# Patient Record
Sex: Male | Born: 1945 | Race: White | Hispanic: No | Smoking: Never smoker
Health system: Southern US, Community
[De-identification: ages and names within clinical notes are randomized; demographics above are authoritative.]

## PROBLEM LIST (undated history)

## (undated) DIAGNOSIS — R079 Chest pain, unspecified: Secondary | ICD-10-CM

## (undated) DIAGNOSIS — G720 Drug-induced myopathy: Secondary | ICD-10-CM

## (undated) DIAGNOSIS — I10 Essential (primary) hypertension: Secondary | ICD-10-CM

## (undated) DIAGNOSIS — E291 Testicular hypofunction: Secondary | ICD-10-CM

## (undated) DIAGNOSIS — N529 Male erectile dysfunction, unspecified: Secondary | ICD-10-CM

## (undated) DIAGNOSIS — E119 Type 2 diabetes mellitus without complications: Secondary | ICD-10-CM

## (undated) DIAGNOSIS — H348112 Central retinal vein occlusion, right eye, stable: Secondary | ICD-10-CM

## (undated) DIAGNOSIS — K219 Gastro-esophageal reflux disease without esophagitis: Secondary | ICD-10-CM

## (undated) DIAGNOSIS — M109 Gout, unspecified: Secondary | ICD-10-CM

## (undated) DIAGNOSIS — E785 Hyperlipidemia, unspecified: Secondary | ICD-10-CM

## (undated) HISTORY — DX: Gastro-esophageal reflux disease without esophagitis: K21.9

## (undated) HISTORY — PX: CHOLECYSTECTOMY: SHX55

## (undated) HISTORY — DX: Essential (primary) hypertension: I10

## (undated) HISTORY — DX: Drug-induced myopathy: G72.0

## (undated) HISTORY — DX: Central retinal vein occlusion, right eye, stable: H34.8112

## (undated) HISTORY — DX: Chest pain, unspecified: R07.9

## (undated) HISTORY — DX: Male erectile dysfunction, unspecified: N52.9

## (undated) HISTORY — DX: Gout, unspecified: M10.9

## (undated) HISTORY — DX: Type 2 diabetes mellitus without complications: E11.9

## (undated) HISTORY — DX: Testicular hypofunction: E29.1

## (undated) HISTORY — DX: Hyperlipidemia, unspecified: E78.5

---

## 2017-01-03 DIAGNOSIS — J0101 Acute recurrent maxillary sinusitis: Secondary | ICD-10-CM | POA: Diagnosis not present

## 2017-01-03 DIAGNOSIS — N401 Enlarged prostate with lower urinary tract symptoms: Secondary | ICD-10-CM | POA: Diagnosis not present

## 2017-01-03 DIAGNOSIS — E291 Testicular hypofunction: Secondary | ICD-10-CM | POA: Diagnosis not present

## 2017-01-08 DIAGNOSIS — E291 Testicular hypofunction: Secondary | ICD-10-CM | POA: Diagnosis not present

## 2017-01-12 DIAGNOSIS — J3089 Other allergic rhinitis: Secondary | ICD-10-CM | POA: Diagnosis not present

## 2017-01-16 DIAGNOSIS — H524 Presbyopia: Secondary | ICD-10-CM | POA: Diagnosis not present

## 2017-01-26 DIAGNOSIS — J320 Chronic maxillary sinusitis: Secondary | ICD-10-CM | POA: Diagnosis not present

## 2017-01-26 DIAGNOSIS — E291 Testicular hypofunction: Secondary | ICD-10-CM | POA: Diagnosis not present

## 2017-01-30 DIAGNOSIS — N401 Enlarged prostate with lower urinary tract symptoms: Secondary | ICD-10-CM | POA: Diagnosis not present

## 2017-01-30 DIAGNOSIS — N5201 Erectile dysfunction due to arterial insufficiency: Secondary | ICD-10-CM | POA: Diagnosis not present

## 2017-01-30 DIAGNOSIS — R351 Nocturia: Secondary | ICD-10-CM | POA: Diagnosis not present

## 2017-01-30 DIAGNOSIS — E291 Testicular hypofunction: Secondary | ICD-10-CM | POA: Diagnosis not present

## 2017-02-01 DIAGNOSIS — R7989 Other specified abnormal findings of blood chemistry: Secondary | ICD-10-CM | POA: Diagnosis not present

## 2017-02-01 DIAGNOSIS — E291 Testicular hypofunction: Secondary | ICD-10-CM | POA: Diagnosis not present

## 2017-03-12 DIAGNOSIS — E291 Testicular hypofunction: Secondary | ICD-10-CM | POA: Diagnosis not present

## 2017-03-21 DIAGNOSIS — E291 Testicular hypofunction: Secondary | ICD-10-CM | POA: Diagnosis not present

## 2017-04-03 DIAGNOSIS — E291 Testicular hypofunction: Secondary | ICD-10-CM | POA: Diagnosis not present

## 2017-04-12 DIAGNOSIS — J40 Bronchitis, not specified as acute or chronic: Secondary | ICD-10-CM | POA: Diagnosis not present

## 2017-04-12 DIAGNOSIS — Z6833 Body mass index (BMI) 33.0-33.9, adult: Secondary | ICD-10-CM | POA: Diagnosis not present

## 2017-04-13 DIAGNOSIS — E291 Testicular hypofunction: Secondary | ICD-10-CM | POA: Diagnosis not present

## 2017-04-16 DIAGNOSIS — Z6833 Body mass index (BMI) 33.0-33.9, adult: Secondary | ICD-10-CM | POA: Diagnosis not present

## 2017-04-16 DIAGNOSIS — J22 Unspecified acute lower respiratory infection: Secondary | ICD-10-CM | POA: Diagnosis not present

## 2017-05-03 DIAGNOSIS — E291 Testicular hypofunction: Secondary | ICD-10-CM | POA: Diagnosis not present

## 2017-05-07 DIAGNOSIS — R609 Edema, unspecified: Secondary | ICD-10-CM | POA: Diagnosis not present

## 2017-05-07 DIAGNOSIS — R600 Localized edema: Secondary | ICD-10-CM | POA: Diagnosis not present

## 2017-05-07 DIAGNOSIS — M79604 Pain in right leg: Secondary | ICD-10-CM | POA: Diagnosis not present

## 2017-05-07 DIAGNOSIS — M7989 Other specified soft tissue disorders: Secondary | ICD-10-CM | POA: Diagnosis not present

## 2017-05-14 DIAGNOSIS — E291 Testicular hypofunction: Secondary | ICD-10-CM | POA: Diagnosis not present

## 2017-05-22 DIAGNOSIS — Z6833 Body mass index (BMI) 33.0-33.9, adult: Secondary | ICD-10-CM | POA: Diagnosis not present

## 2017-05-22 DIAGNOSIS — J019 Acute sinusitis, unspecified: Secondary | ICD-10-CM | POA: Diagnosis not present

## 2017-05-22 DIAGNOSIS — B9689 Other specified bacterial agents as the cause of diseases classified elsewhere: Secondary | ICD-10-CM | POA: Diagnosis not present

## 2017-05-30 DIAGNOSIS — E291 Testicular hypofunction: Secondary | ICD-10-CM | POA: Diagnosis not present

## 2017-06-06 DIAGNOSIS — R351 Nocturia: Secondary | ICD-10-CM | POA: Diagnosis not present

## 2017-06-06 DIAGNOSIS — N5201 Erectile dysfunction due to arterial insufficiency: Secondary | ICD-10-CM | POA: Diagnosis not present

## 2017-06-06 DIAGNOSIS — E291 Testicular hypofunction: Secondary | ICD-10-CM | POA: Diagnosis not present

## 2017-06-06 DIAGNOSIS — N401 Enlarged prostate with lower urinary tract symptoms: Secondary | ICD-10-CM | POA: Diagnosis not present

## 2017-06-15 DIAGNOSIS — E291 Testicular hypofunction: Secondary | ICD-10-CM | POA: Diagnosis not present

## 2017-07-02 DIAGNOSIS — E291 Testicular hypofunction: Secondary | ICD-10-CM | POA: Diagnosis not present

## 2017-07-12 DIAGNOSIS — H43811 Vitreous degeneration, right eye: Secondary | ICD-10-CM | POA: Diagnosis not present

## 2017-07-12 DIAGNOSIS — H2513 Age-related nuclear cataract, bilateral: Secondary | ICD-10-CM | POA: Diagnosis not present

## 2017-07-12 DIAGNOSIS — H04123 Dry eye syndrome of bilateral lacrimal glands: Secondary | ICD-10-CM | POA: Diagnosis not present

## 2017-07-12 DIAGNOSIS — H34831 Tributary (branch) retinal vein occlusion, right eye, with macular edema: Secondary | ICD-10-CM | POA: Diagnosis not present

## 2017-07-16 DIAGNOSIS — E291 Testicular hypofunction: Secondary | ICD-10-CM | POA: Diagnosis not present

## 2017-07-18 DIAGNOSIS — Z9181 History of falling: Secondary | ICD-10-CM | POA: Diagnosis not present

## 2017-07-18 DIAGNOSIS — K219 Gastro-esophageal reflux disease without esophagitis: Secondary | ICD-10-CM | POA: Insufficient documentation

## 2017-07-18 DIAGNOSIS — Z0000 Encounter for general adult medical examination without abnormal findings: Secondary | ICD-10-CM | POA: Diagnosis not present

## 2017-07-18 DIAGNOSIS — Z23 Encounter for immunization: Secondary | ICD-10-CM | POA: Diagnosis not present

## 2017-07-18 DIAGNOSIS — R079 Chest pain, unspecified: Secondary | ICD-10-CM

## 2017-07-18 DIAGNOSIS — I209 Angina pectoris, unspecified: Secondary | ICD-10-CM | POA: Insufficient documentation

## 2017-07-18 HISTORY — DX: Chest pain, unspecified: R07.9

## 2017-07-18 HISTORY — DX: Gastro-esophageal reflux disease without esophagitis: K21.9

## 2017-07-19 ENCOUNTER — Ambulatory Visit (INDEPENDENT_AMBULATORY_CARE_PROVIDER_SITE_OTHER): Payer: PPO | Admitting: Cardiology

## 2017-07-19 ENCOUNTER — Encounter: Payer: Self-pay | Admitting: Cardiology

## 2017-07-19 VITALS — BP 152/84 | HR 64 | Ht 60.0 in | Wt 207.0 lb

## 2017-07-19 DIAGNOSIS — I10 Essential (primary) hypertension: Secondary | ICD-10-CM

## 2017-07-19 DIAGNOSIS — R079 Chest pain, unspecified: Secondary | ICD-10-CM

## 2017-07-19 NOTE — Patient Instructions (Addendum)
Medication Instructions:   Your physician recommends that you continue on your current medications as directed. Please refer to the Current Medication list given to you today.   Labwork: Your physician recommends that you return for lab work in:  Today, Liver Function test and Lipid profile.   Testing/Procedures:  Your physician has requested that you have en exercise stress myoview. For further information please visit [email or web address]. Please follow instruction sheet, as given.     Follow-Up: Your physician recommends that you schedule a follow-up appointment in: In 2 months with Dr. Geraldo Pitter.    Any Other Special Instructions Will Be Listed Below (If Applicable).     If you need a refill on your cardiac medications before your next appointment, please call your pharmacy.

## 2017-07-19 NOTE — Addendum Note (Signed)
Addended by: Orland Penman on: 07/19/2017 11:21 AM   Modules accepted: Orders

## 2017-07-19 NOTE — Progress Notes (Signed)
Cardiology Office Note:    Date:  07/19/2017   ID:  Jack Him., DOB [birth year], MRN [id number]  PCP:  Myer Peer, MD  Cardiologist:  Jenean Lindau, MD   Referring MD: Myer Peer, MD    ASSESSMENT:    1. Chest pain, unspecified type   2. Essential hypertension    PLAN:    In order of problems listed above:  1. The patient's symptoms are of mixed characteristics. I am not able to put her finger on his symptoms to see whether this is angina. He has multiple risk factors for coronary artery disease and yet he has the symptoms when he walks and they go away when he continues walking. In view of those risk factors include his age and the fact that he has family history of coronary artery disease I will set him up for a exercise stress Cardiolite. He will not take his beta blockers on the morning of the test. He will continue taking his aspirin. 2. His blood pressure stable and he will continue to monitor this. 3. He does have sublingual nitroglycerin and knows how to use it. He will go to the nearest emergency room if this does not work. 4. He gives history of diet-controlled diabetes mellitus. We will check his lipids. He will need to be considered for statin therapy in view of his diabetes mellitus. He will be seen in follow-up appointment in 2 months or earlier if he has any concerns.   Medication Adjustments/Labs and Tests Ordered: Current medicines are reviewed at length with the patient today.  Concerns regarding medicines are outlined above.  No orders of the defined types were placed in this encounter.  No orders of the defined types were placed in this encounter.    History of Present Illness:    Jack Blasius. is a 71 y.o. male who is being seen today for the evaluation of Chest pain at the request of Myer Peer, MD. Patient is a pleasant 71 year old male. He is previously unknown to me. He has history of essential hypertension and diet-controlled  diabetes mellitus. Patient mentions to me that he can walk 3-4 miles a day on a regular basis and does so. However when he walks up. He feels some chest discomfort. Then he continues walking and walks downhill without taking a break and with this he has no symptoms at all. At the time of my evaluation is alert awake oriented and in no distress. And this happens at times only he has an unusual sensation in his left arm. No orthopnea or PND. At the time of my evaluation he is alert awake oriented and in no distress. His daughter accompanies him for this visit  Past Medical History:  Diagnosis Date  . Acid reflux 07/18/2017  . Chest pain 07/18/2017  . Gout   . Hypertension     Past Surgical History:  Procedure Laterality Date  . CHOLECYSTECTOMY      Current Medications: Current Meds  Medication Sig  . allopurinol (ZYLOPRIM) 300 MG tablet Take 300 mg by mouth daily.  Marland Kitchen aspirin 81 MG chewable tablet Chew 81 mg by mouth daily.  Marland Kitchen azelastine (ASTELIN) 0.1 % nasal spray Place 1 spray into both nostrils 2 (two) times daily as needed for allergies.  . bumetanide (BUMEX) 0.5 MG tablet Take 0.5 mg by mouth daily.  . cycloSPORINE (RESTASIS) 0.05 % ophthalmic emulsion Place 1 drop into both eyes 2 (two) times daily.  Marland Kitchen  esomeprazole (NEXIUM) 40 MG capsule Take 40 mg by mouth daily.  . fluticasone (FLONASE) 50 MCG/ACT nasal spray Place 1 spray into both nostrils daily.  . Magnesium 250 MG TABS Take 1 tablet by mouth daily.  . metoprolol tartrate (LOPRESSOR) 50 MG tablet Take 50 mg by mouth 3 (three) times daily.  . nitroGLYCERIN (NITROSTAT) 0.4 MG SL tablet Place 0.4 mg under the tongue every 5 (five) minutes x 3 doses as needed for chest pain.  . Omega-3 Fatty Acids (FISH OIL PO) Take 1,000 mg by mouth daily.  . polyethylene glycol (MIRALAX / GLYCOLAX) packet Take 17 g by mouth daily.  . psyllium (METAMUCIL SMOOTH TEXTURE) 28 % packet Take 1 packet by mouth daily.  . ranitidine (ZANTAC) 300 MG tablet  Take 300 mg by mouth daily.  . tamsulosin (FLOMAX) 0.4 MG CAPS capsule Take 0.4 capsules by mouth daily.  Marland Kitchen testosterone cypionate (DEPOTESTOSTERONE CYPIONATE) 200 MG/ML injection Inject 0.4 ml twice monthly  . Zinc 50 MG TABS Take 1 tablet by mouth daily.     Allergies:   Azithromycin; Ciprofloxacin; Doxycycline; Flomax [tamsulosin hcl]; Levofloxacin; Penicillins; Ramipril; Sulfa antibiotics; Suprax [cefixime]; and Tramadol   Social History   Social History  . Marital status: Widowed    Spouse name: N/A  . Number of children: N/A  . Years of education: N/A   Social History Main Topics  . Smoking status: Never Smoker  . Smokeless tobacco: Never Used  . Alcohol use No  . Drug use: No  . Sexual activity: Not Asked   Other Topics Concern  . None   Social History Narrative  . None     Family History: The patient's family history includes Dementia in his mother.  ROS:   Please see the history of present illness.    All other systems reviewed and are negative.  EKGs/Labs/Other Studies Reviewed:    The following studies were reviewed today:EKG reveals sinus rhythm and nonspecific ST-T changes    Recent Labs: No results found for requested labs within last 8760 hours.  Recent Lipid Panel No results found for: CHOL, TRIG, HDL, CHOLHDL, VLDL, LDLCALC, LDLDIRECT  Physical Exam:    VS:  BP (!) 152/84   Pulse 64   Ht 5' (1.524 m)   Wt 207 lb (93.9 kg)   SpO2 98%   BMI 40.43 kg/m     Wt Readings from Last 3 Encounters:  07/19/17 207 lb (93.9 kg)     GEN: Patient is in no acute distress HEENT: Normal NECK: No JVD; No carotid bruits LYMPHATICS: No lymphadenopathy CARDIAC: S1 S2 regular, 2/6 systolic murmur at the apex. RESPIRATORY:  Clear to auscultation without rales, wheezing or rhonchi  ABDOMEN: Soft, non-tender, non-distended MUSCULOSKELETAL:  No edema; No deformity  SKIN: Warm and dry NEUROLOGIC:  Alert and oriented x 3 PSYCHIATRIC:  Normal affect     Signed, Jenean Lindau, MD  07/19/2017 10:34 AM    [place]

## 2017-07-20 LAB — HEPATIC FUNCTION PANEL
ALK PHOS: 62 IU/L (ref 39–117)
ALT: 20 IU/L (ref 0–44)
AST: 24 IU/L (ref 0–40)
Albumin: 4.6 g/dL (ref 3.6–4.8)
Bilirubin Total: 0.8 mg/dL (ref 0.0–1.2)
Bilirubin, Direct: 0.23 mg/dL (ref 0.00–0.40)
TOTAL PROTEIN: 7.3 g/dL (ref 6.0–8.5)

## 2017-07-20 LAB — LIPID PANEL
CHOLESTEROL TOTAL: 157 mg/dL (ref 100–199)
Chol/HDL Ratio: 3.7 ratio (ref 0.0–5.0)
HDL: 42 mg/dL (ref >39)
LDL Calculated: 84 mg/dL (ref 0–99)
TRIGLYCERIDES: 154 mg/dL — AB (ref 0–149)
VLDL Cholesterol Cal: 31 mg/dL (ref 5–40)

## 2017-07-23 ENCOUNTER — Telehealth (HOSPITAL_COMMUNITY): Payer: Self-pay | Admitting: *Deleted

## 2017-07-23 NOTE — Telephone Encounter (Signed)
Patient given detailed instructions per Myocardial Perfusion Study Information Sheet for the test on 07/25/17. Patient notified to arrive 15 minutes early and that it is imperative to arrive on time for appointment to keep from having the test rescheduled.  If you need to cancel or reschedule your appointment, please call the office within 24 hours of your appointment. . Patient verbalized understanding. Shelley Cocke Jacqueline    

## 2017-07-25 ENCOUNTER — Ambulatory Visit (HOSPITAL_COMMUNITY): Payer: PPO | Attending: Cardiology

## 2017-07-25 DIAGNOSIS — Z8249 Family history of ischemic heart disease and other diseases of the circulatory system: Secondary | ICD-10-CM | POA: Insufficient documentation

## 2017-07-25 DIAGNOSIS — R079 Chest pain, unspecified: Secondary | ICD-10-CM | POA: Insufficient documentation

## 2017-07-25 DIAGNOSIS — I10 Essential (primary) hypertension: Secondary | ICD-10-CM | POA: Diagnosis not present

## 2017-07-25 DIAGNOSIS — R9439 Abnormal result of other cardiovascular function study: Secondary | ICD-10-CM | POA: Insufficient documentation

## 2017-07-25 HISTORY — PX: NM MYOVIEW LTD: HXRAD82

## 2017-07-25 LAB — MYOCARDIAL PERFUSION IMAGING
CHL CUP NUCLEAR SSS: 7
CSEPED: 5 min
Estimated workload: 7 METS
Exercise duration (sec): 31 s
LV dias vol: 108 mL (ref 62–150)
LV sys vol: 42 mL
MPHR: 151 {beats}/min
Peak HR: 137 {beats}/min
Percent HR: 90 %
RATE: 0.31
RPE: 16
Rest HR: 63 {beats}/min
SDS: 3
SRS: 4
TID: 0.92

## 2017-07-25 MED ORDER — TECHNETIUM TC 99M TETROFOSMIN IV KIT
10.4000 | PACK | Freq: Once | INTRAVENOUS | Status: AC | PRN
  Administered 2017-07-25: 10.4 via INTRAVENOUS
  Filled 2017-07-25: qty 11

## 2017-07-25 MED ORDER — TECHNETIUM TC 99M TETROFOSMIN IV KIT
32.7000 | PACK | Freq: Once | INTRAVENOUS | Status: AC | PRN
Start: 2017-07-25 — End: 2017-07-25
  Administered 2017-07-25: 32.7 via INTRAVENOUS
  Filled 2017-07-25: qty 33

## 2017-07-29 ENCOUNTER — Encounter: Payer: Self-pay | Admitting: Cardiology

## 2017-07-30 ENCOUNTER — Ambulatory Visit (HOSPITAL_BASED_OUTPATIENT_CLINIC_OR_DEPARTMENT_OTHER)
Admission: RE | Admit: 2017-07-30 | Discharge: 2017-07-30 | Disposition: A | Discharge status: Home / Self Care | Payer: PPO | Source: Ambulatory Visit | Attending: Cardiology | Admitting: Cardiology

## 2017-07-30 ENCOUNTER — Ambulatory Visit (INDEPENDENT_AMBULATORY_CARE_PROVIDER_SITE_OTHER): Payer: PPO | Admitting: Cardiology

## 2017-07-30 ENCOUNTER — Encounter: Payer: Self-pay | Admitting: Cardiology

## 2017-07-30 ENCOUNTER — Telehealth: Payer: Self-pay | Admitting: Cardiology

## 2017-07-30 VITALS — BP 180/76 | HR 59 | Ht 67.0 in | Wt 206.1 lb

## 2017-07-30 DIAGNOSIS — I10 Essential (primary) hypertension: Secondary | ICD-10-CM | POA: Insufficient documentation

## 2017-07-30 DIAGNOSIS — R9439 Abnormal result of other cardiovascular function study: Secondary | ICD-10-CM | POA: Diagnosis not present

## 2017-07-30 DIAGNOSIS — I517 Cardiomegaly: Secondary | ICD-10-CM | POA: Diagnosis not present

## 2017-07-30 DIAGNOSIS — I209 Angina pectoris, unspecified: Secondary | ICD-10-CM

## 2017-07-30 DIAGNOSIS — Z01818 Encounter for other preprocedural examination: Secondary | ICD-10-CM | POA: Diagnosis not present

## 2017-07-30 MED ORDER — AMLODIPINE BESYLATE 5 MG PO TABS: 5.0000 mg | ORAL_TABLET | Freq: Every day | ORAL | 3 refills | Status: DC

## 2017-07-30 NOTE — Telephone Encounter (Signed)
Has questions about the new BP med he was started on

## 2017-07-30 NOTE — Telephone Encounter (Signed)
Patient advised to continue metoprolol along with starting amlodipine.

## 2017-07-30 NOTE — Progress Notes (Signed)
Cardiology Office Note:    Date:  07/31/2017   ID:  [name]., DOB September 04, 1946, MRN [id number]  PCP:  Heywood Bene, MD  Cardiologist:  Norman Herrlich, MD    Referring MD: Heywood Bene, MD    ASSESSMENT:    1. Angina pectoris (HCC)   2. Essential hypertension   3. Abnormal myocardial perfusion study    PLAN:    In order of problems listed above:  1. He has typical exertional walk 3 angina but is also at rest nocturnal episodes his symptoms are somewhat unstable and will be referred for coronary angiography 2. Poorly controlled, ARB added to his medical regimen 3. Plan for coronary angiography as detailed under history   Next appointment: 3-4 weeks   Medication Adjustments/Labs and Tests Ordered: Current medicines are reviewed at length with the patient today.  Concerns regarding medicines are outlined above.  Orders Placed This Encounter  Procedures  . DG Chest 2 View  . Comprehensive Metabolic Panel (CMET)  . CBC with Differential/Platelet   Meds ordered this encounter  Medications  . amLODipine (NORVASC) 5 MG tablet    Sig: Take 1 tablet (5 mg total) by mouth daily.    Dispense:  30 tablet    Refill:  3    Chief Complaint  Patient presents with  . Follow-up    abnormal nuc pt states still has some chest pain/ but also reflux so not really sure between the two.    History of Present Illness:    Jack Oconnor. is a 71 y.o. male with a hx of walk through exertional angina and hypertension last seen 07/19/17. For the last 4 months he has had typical exertional angina that occurs in the morning when he first starts to walk or walks uphill and after he is able to slow his pace or to walk downhill the symptoms resolved and do not recur on his daily exercise. He's not been forced to stop and rest as of yet. The chest pain is pressure left precordium radiates through the back no associated shortness of breath nausea or vomiting moderate in nature. In the last  few weeks he has had one episode that awakened him from his sleep lasted approximately 5 minutes. He also has belching associated with this but is not having typical symptoms of reflux. Recent myocardial perfusion study which showed a markedly abnormal EKG persisting 5-9 minutes into recovery and normal perfusion images raising concern for balanced ischemia and multivessel CAD. I met with the patient the office reviewed the findings. With his episode of nocturnal angina concern his symptoms are becoming unstable and with his markedly abnormal EKG and concerns of multivessel ischemia with a balance perfusion image I advised him undergo coronary angiography. Benefits risk and options were detailed he has no dye allergy he is not at risk of bleeding from dual antiplatelet therapy and is compliant with medical follow-up. The patient chooses angiography over them. Medical therapy and be scheduled for coronary angiography in the next 2 weeks.    Stress test 07/25/17:Study Highlights     Nuclear stress EF: 61%.  Blood pressure demonstrated a hypertensive response to exercise.  Horizontal ST segment depression ST segment depression of 1.5 mm was noted during stress in the II, III, aVF, V4, V5 and V6 leads, and returning to baseline after 5-9 minutes of recovery.  The study is normal.  The left ventricular ejection fraction is normal (55-65%).   1. Hypertensive BP response  to exercise.  2. Significant ST segment depression on exercise ECG concerning for ischemia.  3. EF 61% with normal wall motion.  4. Fixed, medium-sized, mild basal to mid inferior perfusion defect.  Given normal wall motion, this is suggestive of soft tissue attenuation.   Equivocal study.  Concerning for ischemia by exercise ECG but no definite ischemia by perfusion images.     Compliance with diet, lifestyle and medications: Yes Past Medical History:  Diagnosis Date  . Acid reflux 07/18/2017  . Chest pain 07/18/2017  . Gout    . Hypertension     Past Surgical History:  Procedure Laterality Date  . CHOLECYSTECTOMY      Current Medications: Current Meds  Medication Sig  . allopurinol (ZYLOPRIM) 300 MG tablet Take 300 mg by mouth at bedtime.   Marland Kitchen azelastine (ASTELIN) 0.1 % nasal spray Place 1 spray into both nostrils 2 (two) times daily as needed for allergies.  . bumetanide (BUMEX) 0.5 MG tablet Take 0.5 mg by mouth daily.  . cycloSPORINE (RESTASIS) 0.05 % ophthalmic emulsion Place 1 drop into both eyes 2 (two) times daily.  Marland Kitchen esomeprazole (NEXIUM) 40 MG capsule Take 40 mg by mouth every morning.   . fluticasone (FLONASE) 50 MCG/ACT nasal spray Place 1 spray into both nostrils daily as needed for allergies.   . Magnesium 250 MG TABS Take 1 tablet by mouth daily.  . metoprolol tartrate (LOPRESSOR) 50 MG tablet Take 50 mg by mouth 3 (three) times daily.  . nitroGLYCERIN (NITROSTAT) 0.4 MG SL tablet Place 0.4 mg under the tongue every 5 (five) minutes x 3 doses as needed for chest pain.  . Omega-3 Fatty Acids (FISH OIL PO) Take 1,000 mg by mouth daily.  . polyethylene glycol (MIRALAX / GLYCOLAX) packet Take by mouth daily. 1 tablespoon mixed with Benefiber daily every afternoon  . ranitidine (ZANTAC) 300 MG tablet Take 300 mg by mouth at bedtime.   . tamsulosin (FLOMAX) 0.4 MG CAPS capsule Take 0.4 capsules by mouth daily after supper.   . testosterone cypionate (DEPOTESTOSTERONE CYPIONATE) 200 MG/ML injection Inject 80 mg into the muscle See admin instructions. Inject 0.4 ml twice monthly  10th and 20th  . Zinc 50 MG TABS Take 1 tablet by mouth daily.  . [DISCONTINUED] aspirin 81 MG chewable tablet Chew 81 mg by mouth daily.  . [DISCONTINUED] psyllium (METAMUCIL SMOOTH TEXTURE) 28 % packet Take 1 packet by mouth daily.     Allergies:   Azithromycin; Ciprofloxacin; Doxycycline; Levofloxacin; Penicillins; Ramipril; Sulfa antibiotics; and Suprax [cefixime]   Social History   Social History  . Marital status:  Widowed    Spouse name: N/A  . Number of children: N/A  . Years of education: N/A   Social History Main Topics  . Smoking status: Never Smoker  . Smokeless tobacco: Never Used  . Alcohol use No  . Drug use: No  . Sexual activity: Not Asked   Other Topics Concern  . None   Social History Narrative  . None     Family History: The patient's family history includes Dementia in his mother. ROS:   Please see the history of present illness.    All other systems reviewed and are negative.  EKGs/Labs/Other Studies Reviewed:    The following studies were reviewed today:  Recent Labs: 07/30/2017: ALT 17; BUN 14; Creatinine, Ser 1.05; Potassium 4.7; Sodium 142 07/31/2017: Hemoglobin 16.5; Platelets 192  Recent Lipid Panel    Component Value Date/Time   CHOL 157 07/19/2017  1116   TRIG 154 (H) 07/19/2017 1116   HDL 42 07/19/2017 1116   CHOLHDL 3.7 07/19/2017 1116   LDLCALC 84 07/19/2017 1116    Physical Exam:    VS:  BP (!) 180/76   Pulse (!) 59   Ht '5\' 7"'$  (1.702 m)   Wt 206 lb 1.9 oz (93.5 kg)   SpO2 99%   BMI 32.28 kg/m     Wt Readings from Last 3 Encounters:  07/31/17 206 lb (93.4 kg)  07/30/17 206 lb 1.9 oz (93.5 kg)  07/25/17 207 lb (93.9 kg)     GEN:   Well nourished, well developed in no acute distress HEENT: Normal NECK: No JVD; No carotid bruits LYMPHATICS: No lymphadenopathy CARDIAC:  RRR, no murmurs, rubs, gallops RESPIRATORY:  Clear to auscultation without rales, wheezing or rhonchi  ABDOMEN: Soft, non-tender, non-distended MUSCULOSKELETAL:  No edema; No deformity  SKIN: Warm and dry NEUROLOGIC:  Alert and oriented x 3 PSYCHIATRIC:  Normal affect    Signed, Shirlee More, MD  07/31/2017 7:38 AM    [place]

## 2017-07-30 NOTE — Patient Instructions (Addendum)
Medication Instructions:  Your physician has recommended you make the following change in your medication:  START amlodopine (Norvasc) 5 mg daily   Labwork: Your physician recommends that you return for lab work in: today. CMP, CBC   Testing/Procedures: Your physician has requested that you have a cardiac catheterization. Cardiac catheterization is used to diagnose and/or treat various heart conditions. Doctors may recommend this procedure for a number of different reasons. The most common reason is to evaluate chest pain. Chest pain can be a symptom of coronary artery disease (CAD), and cardiac catheterization can show whether plaque is narrowing or blocking your heart's arteries. This procedure is also used to evaluate the valves, as well as measure the blood flow and oxygen levels in different parts of your heart. For further information please visit [email or web address]. Please follow instruction sheet, as given.  A chest x-ray takes a picture of the organs and structures inside the chest, including the heart, lungs, and blood vessels. This test can show several things, including, whether the heart is enlarges; whether fluid is building up in the lungs; and whether pacemaker / defibrillator leads are still in place.    Follow-Up: Your physician recommends that you schedule a follow-up appointment in: 3 weeks with Dr. Geraldo Pitter   Any Other Special Instructions Will Be Listed Below (If Applicable).     If you need a refill on your cardiac medications before your next appointment, please call your pharmacy.

## 2017-07-31 ENCOUNTER — Encounter (HOSPITAL_COMMUNITY): Payer: Self-pay | Admitting: Cardiology

## 2017-07-31 ENCOUNTER — Encounter (HOSPITAL_COMMUNITY)
Admission: RE | Disposition: A | Discharge status: Home / Self Care | Payer: Self-pay | Source: Ambulatory Visit | Attending: Cardiology

## 2017-07-31 ENCOUNTER — Ambulatory Visit (HOSPITAL_COMMUNITY)
Admission: RE | Admit: 2017-07-31 | Discharge: 2017-07-31 | Disposition: A | Discharge status: Home / Self Care | Payer: PPO | Source: Ambulatory Visit | Attending: Cardiology | Admitting: Cardiology

## 2017-07-31 DIAGNOSIS — Z882 Allergy status to sulfonamides status: Secondary | ICD-10-CM | POA: Diagnosis not present

## 2017-07-31 DIAGNOSIS — K219 Gastro-esophageal reflux disease without esophagitis: Secondary | ICD-10-CM | POA: Diagnosis not present

## 2017-07-31 DIAGNOSIS — Z7951 Long term (current) use of inhaled steroids: Secondary | ICD-10-CM | POA: Insufficient documentation

## 2017-07-31 DIAGNOSIS — E785 Hyperlipidemia, unspecified: Secondary | ICD-10-CM | POA: Diagnosis not present

## 2017-07-31 DIAGNOSIS — E119 Type 2 diabetes mellitus without complications: Secondary | ICD-10-CM | POA: Insufficient documentation

## 2017-07-31 DIAGNOSIS — R079 Chest pain, unspecified: Secondary | ICD-10-CM | POA: Insufficient documentation

## 2017-07-31 DIAGNOSIS — Z880 Allergy status to penicillin: Secondary | ICD-10-CM | POA: Diagnosis not present

## 2017-07-31 DIAGNOSIS — Z7982 Long term (current) use of aspirin: Secondary | ICD-10-CM | POA: Insufficient documentation

## 2017-07-31 DIAGNOSIS — Z6841 Body Mass Index (BMI) 40.0 and over, adult: Secondary | ICD-10-CM | POA: Insufficient documentation

## 2017-07-31 DIAGNOSIS — I10 Essential (primary) hypertension: Secondary | ICD-10-CM | POA: Diagnosis not present

## 2017-07-31 DIAGNOSIS — R9439 Abnormal result of other cardiovascular function study: Secondary | ICD-10-CM | POA: Insufficient documentation

## 2017-07-31 DIAGNOSIS — E669 Obesity, unspecified: Secondary | ICD-10-CM | POA: Insufficient documentation

## 2017-07-31 DIAGNOSIS — M109 Gout, unspecified: Secondary | ICD-10-CM | POA: Insufficient documentation

## 2017-07-31 DIAGNOSIS — I209 Angina pectoris, unspecified: Secondary | ICD-10-CM | POA: Diagnosis not present

## 2017-07-31 HISTORY — PX: LEFT HEART CATH AND CORONARY ANGIOGRAPHY: CATH118249

## 2017-07-31 LAB — CBC
HCT: 49 % (ref 39.0–52.0)
Hemoglobin: 16.5 g/dL (ref 13.0–17.0)
MCH: 30.3 pg (ref 26.0–34.0)
MCHC: 33.7 g/dL (ref 30.0–36.0)
MCV: 89.9 fL (ref 78.0–100.0)
PLATELETS: 192 10*3/uL (ref 150–400)
RBC: 5.45 MIL/uL (ref 4.22–5.81)
RDW: 14.5 % (ref 11.5–15.5)
WBC: 7.6 10*3/uL (ref 4.0–10.5)

## 2017-07-31 LAB — COMPREHENSIVE METABOLIC PANEL
A/G RATIO: 1.6 (ref 1.2–2.2)
ALBUMIN: 4.5 g/dL (ref 3.6–4.8)
ALT: 17 IU/L (ref 0–44)
AST: 21 IU/L (ref 0–40)
Alkaline Phosphatase: 64 IU/L (ref 39–117)
BILIRUBIN TOTAL: 1 mg/dL (ref 0.0–1.2)
BUN / CREAT RATIO: 13 (ref 10–24)
BUN: 14 mg/dL (ref 8–27)
CHLORIDE: 99 mmol/L (ref 96–106)
CO2: 25 mmol/L (ref 20–29)
Calcium: 9.8 mg/dL (ref 8.6–10.2)
Creatinine, Ser: 1.05 mg/dL (ref 0.76–1.27)
GFR calc non Af Amer: 72 mL/min/{1.73_m2} (ref >59)
GFR, EST AFRICAN AMERICAN: 83 mL/min/{1.73_m2} (ref >59)
GLOBULIN, TOTAL: 2.9 g/dL (ref 1.5–4.5)
Glucose: 89 mg/dL (ref 65–99)
POTASSIUM: 4.7 mmol/L (ref 3.5–5.2)
Sodium: 142 mmol/L (ref 134–144)
TOTAL PROTEIN: 7.4 g/dL (ref 6.0–8.5)

## 2017-07-31 LAB — PROTIME-INR
INR: 1.06
PROTHROMBIN TIME: 13.8 s (ref 11.4–15.2)

## 2017-07-31 SURGERY — LEFT HEART CATH AND CORONARY ANGIOGRAPHY
Anesthesia: LOCAL

## 2017-07-31 MED ORDER — SODIUM CHLORIDE 0.9 % WEIGHT BASED INFUSION: 1.0000 mL/kg/h | INTRAVENOUS | Status: DC

## 2017-07-31 MED ORDER — MIDAZOLAM HCL 2 MG/2ML IJ SOLN
INTRAMUSCULAR | Status: AC
  Filled 2017-07-31: qty 2

## 2017-07-31 MED ORDER — IOPAMIDOL (ISOVUE-370) INJECTION 76%
INTRAVENOUS | Status: DC | PRN
  Administered 2017-07-31: 90 mL via INTRA_ARTERIAL

## 2017-07-31 MED ORDER — SODIUM CHLORIDE 0.9% FLUSH: 3.0000 mL | Freq: Two times a day (BID) | INTRAVENOUS | Status: DC

## 2017-07-31 MED ORDER — LIDOCAINE HCL (PF) 1 % IJ SOLN
INTRAMUSCULAR | Status: AC
  Filled 2017-07-31: qty 30

## 2017-07-31 MED ORDER — MIDAZOLAM HCL 2 MG/2ML IJ SOLN
INTRAMUSCULAR | Status: DC | PRN
  Administered 2017-07-31: 2 mg via INTRAVENOUS

## 2017-07-31 MED ORDER — HEPARIN (PORCINE) IN NACL 2-0.9 UNIT/ML-% IJ SOLN
INTRAMUSCULAR | Status: DC | PRN
  Administered 2017-07-31: 10 mL via INTRA_ARTERIAL

## 2017-07-31 MED ORDER — SODIUM CHLORIDE 0.9 % IV SOLN: 250.0000 mL | INTRAVENOUS | Status: DC | PRN

## 2017-07-31 MED ORDER — HEPARIN (PORCINE) IN NACL 2-0.9 UNIT/ML-% IJ SOLN
INTRAMUSCULAR | Status: AC | PRN
  Administered 2017-07-31: 1000 mL

## 2017-07-31 MED ORDER — ASPIRIN 81 MG PO CHEW: 81.0000 mg | CHEWABLE_TABLET | ORAL | Status: AC

## 2017-07-31 MED ORDER — SODIUM CHLORIDE 0.9% FLUSH: 3.0000 mL | INTRAVENOUS | Status: DC | PRN

## 2017-07-31 MED ORDER — SODIUM CHLORIDE 0.9 % IV SOLN: INTRAVENOUS | Status: DC

## 2017-07-31 MED ORDER — HEPARIN (PORCINE) IN NACL 2-0.9 UNIT/ML-% IJ SOLN
INTRAMUSCULAR | Status: AC
  Filled 2017-07-31: qty 1000

## 2017-07-31 MED ORDER — ACETAMINOPHEN 325 MG PO TABS: 650.0000 mg | ORAL_TABLET | ORAL | Status: DC | PRN

## 2017-07-31 MED ORDER — SODIUM CHLORIDE 0.9 % WEIGHT BASED INFUSION
3.0000 mL/kg/h | INTRAVENOUS | Status: AC
  Administered 2017-07-31: 3 mL/kg/h via INTRAVENOUS

## 2017-07-31 MED ORDER — ONDANSETRON HCL 4 MG/2ML IJ SOLN: 4.0000 mg | Freq: Four times a day (QID) | INTRAMUSCULAR | Status: DC | PRN

## 2017-07-31 MED ORDER — LIDOCAINE HCL (PF) 1 % IJ SOLN
INTRAMUSCULAR | Status: DC | PRN
Start: 2017-07-31 — End: 2017-07-31
  Administered 2017-07-31: 2 mL

## 2017-07-31 MED ORDER — HEPARIN SODIUM (PORCINE) 1000 UNIT/ML IJ SOLN
INTRAMUSCULAR | Status: DC | PRN
Start: 2017-07-31 — End: 2017-07-31
  Administered 2017-07-31: 5000 [IU] via INTRAVENOUS

## 2017-07-31 MED ORDER — IOPAMIDOL (ISOVUE-370) INJECTION 76%
INTRAVENOUS | Status: AC
Start: 2017-07-31 — End: 2017-07-31
  Filled 2017-07-31: qty 100

## 2017-07-31 MED ORDER — FENTANYL CITRATE (PF) 100 MCG/2ML IJ SOLN
INTRAMUSCULAR | Status: DC | PRN
  Administered 2017-07-31: 50 ug via INTRAVENOUS

## 2017-07-31 MED ORDER — VERAPAMIL HCL 2.5 MG/ML IV SOLN
INTRAVENOUS | Status: AC
  Filled 2017-07-31: qty 2

## 2017-07-31 MED ORDER — FENTANYL CITRATE (PF) 100 MCG/2ML IJ SOLN
INTRAMUSCULAR | Status: AC
  Filled 2017-07-31: qty 2

## 2017-07-31 MED ORDER — HEPARIN SODIUM (PORCINE) 1000 UNIT/ML IJ SOLN
INTRAMUSCULAR | Status: AC
  Filled 2017-07-31: qty 1

## 2017-07-31 SURGICAL SUPPLY — 16 items
CATH INFINITI 5FR ANG PIGTAIL (CATHETERS) ×2 IMPLANT
CATH LAUNCHER 5F RADL (CATHETERS) ×1 IMPLANT
CATH LAUNCHER 5F RADR (CATHETERS) ×1 IMPLANT
CATH OPTITORQUE TIG 4.0 5F (CATHETERS) ×2 IMPLANT
CATHETER LAUNCHER 5F RADL (CATHETERS) ×2
CATHETER LAUNCHER 5F RADR (CATHETERS) ×2
DEVICE RAD COMP TR BAND LRG (VASCULAR PRODUCTS) ×2 IMPLANT
GLIDESHEATH SLEND A-KIT 6F 22G (SHEATH) ×2 IMPLANT
GUIDEWIRE INQWIRE 1.5J.035X260 (WIRE) ×1 IMPLANT
INQWIRE 1.5J .035X260CM (WIRE) ×2
KIT HEART LEFT (KITS) ×2 IMPLANT
PACK CARDIAC CATHETERIZATION (CUSTOM PROCEDURE TRAY) ×2 IMPLANT
SYR MEDRAD MARK V 150ML (SYRINGE) ×2 IMPLANT
TRANSDUCER W/STOPCOCK (MISCELLANEOUS) ×2 IMPLANT
TUBING CIL FLEX 10 FLL-RA (TUBING) ×2 IMPLANT
WIRE HI TORQ VERSACORE-J 145CM (WIRE) ×2 IMPLANT

## 2017-07-31 NOTE — Interval H&P Note (Signed)
History and Physical Interval Note:  07/31/2017 7:13 AM  Jack Oconnor.  has presented today for surgery, with the diagnosis of abnormal nuclear stress test ordered for Sx c/w angina Class II.   Nuclear stress EF: 61%.  Blood pressure demonstrated a hypertensive response to exercise.  Horizontal ST segment depression ST segment depression of 1.5 mm was noted during stress in the II, III, aVF, V4, V5 and V6 leads, and returning to baseline after 5-9 minutes of recovery.  The study is normal.  The left ventricular ejection fraction is normal (55-65%).   1. Hypertensive BP response to exercise.  2. Significant ST segment depression on exercise ECG concerning for ischemia.  3. EF 61% with normal wall motion.  4. Fixed, medium-sized, mild basal to mid inferior perfusion defect.  Given normal wall motion, this is suggestive of soft tissue attenuation.   Equivocal study.  Concerning for ischemia by exercise ECG but no definite ischemia by perfusion images.    Seen by Dr. Bettina Gavia yesterday in f/u after initial consultation by Dr.Ravenkar to discuss ST results.  Still has L-sided chest burning sensation with his daily walk - most notably going up-hill.  Is having some mild discomfort this AM.  --> With conflicting data on ST & continued Sx, he is referred for invasive evaluation with Cardiac Catheterizatio +/- PCI.    The various methods of treatment have been discussed with the patient and family. After consideration of risks, benefits and other options for treatment, the patient has consented to  Procedure(s): LEFT HEART CATH AND CORONARY ANGIOGRAPHY (N/A) as a surgical intervention .  The patient's history has been reviewed, patient examined, no change in status, stable for surgery.  I have reviewed the patient's chart and labs.  Questions were answered to the patient's satisfaction.    Cath Lab Visit (complete for each Cath Lab visit)  Clinical Evaluation Leading to the Procedure:    ACS: No.  Non-ACS:    Anginal Classification: CCS II  Anti-ischemic medical therapy: Maximal Therapy (2 or more classes of medications)  Non-Invasive Test Results: Equivocal test results  Prior CABG: No previous CABG   Glenetta Hew

## 2017-07-31 NOTE — Discharge Instructions (Signed)

## 2017-07-31 NOTE — H&P (View-Only) (Signed)
Cardiology Office Note:    Date:  07/19/2017   ID:  Jack Him., DOB [birth year], MRN [id number]  PCP:  Myer Peer, MD  Cardiologist:  Jenean Lindau, MD   Referring MD: Myer Peer, MD    ASSESSMENT:    1. Chest pain, unspecified type   2. Essential hypertension    PLAN:    In order of problems listed above:  1. The patient's symptoms are of mixed characteristics. I am not able to put her finger on his symptoms to see whether this is angina. He has multiple risk factors for coronary artery disease and yet he has the symptoms when he walks and they go away when he continues walking. In view of those risk factors include his age and the fact that he has family history of coronary artery disease I will set him up for a exercise stress Cardiolite. He will not take his beta blockers on the morning of the test. He will continue taking his aspirin. 2. His blood pressure stable and he will continue to monitor this. 3. He does have sublingual nitroglycerin and knows how to use it. He will go to the nearest emergency room if this does not work. 4. He gives history of diet-controlled diabetes mellitus. We will Oconnor his lipids. He will need to be considered for statin therapy in view of his diabetes mellitus. He will be seen in follow-up appointment in 2 months or earlier if he has any concerns.   Medication Adjustments/Labs and Tests Ordered: Current medicines are reviewed at length with the patient today.  Concerns regarding medicines are outlined above.  No orders of the defined types were placed in this encounter.  No orders of the defined types were placed in this encounter.    History of Present Illness:    Jack Oconnor. is a 71 y.o. male who is being seen today for the evaluation of Chest pain at the request of Myer Peer, MD. Patient is a pleasant 71 year old male. He is previously unknown to me. He has history of essential hypertension and diet-controlled  diabetes mellitus. Patient mentions to me that he can walk 3-4 miles a day on a regular basis and does so. However when he walks up. He feels some chest discomfort. Then he continues walking and walks downhill without taking a break and with this he has no symptoms at all. At the time of my evaluation is alert awake oriented and in no distress. And this happens at times only he has an unusual sensation in his left arm. No orthopnea or PND. At the time of my evaluation he is alert awake oriented and in no distress. His daughter accompanies him for this visit  Past Medical History:  Diagnosis Date  . Acid reflux 07/18/2017  . Chest pain 07/18/2017  . Gout   . Hypertension     Past Surgical History:  Procedure Laterality Date  . CHOLECYSTECTOMY      Current Medications: Current Meds  Medication Sig  . allopurinol (ZYLOPRIM) 300 MG tablet Take 300 mg by mouth daily.  Marland Kitchen aspirin 81 MG chewable tablet Chew 81 mg by mouth daily.  Marland Kitchen azelastine (ASTELIN) 0.1 % nasal spray Place 1 spray into both nostrils 2 (two) times daily as needed for allergies.  . bumetanide (BUMEX) 0.5 MG tablet Take 0.5 mg by mouth daily.  . cycloSPORINE (RESTASIS) 0.05 % ophthalmic emulsion Place 1 drop into both eyes 2 (two) times daily.  Marland Kitchen  esomeprazole (NEXIUM) 40 MG capsule Take 40 mg by mouth daily.  . fluticasone (FLONASE) 50 MCG/ACT nasal spray Place 1 spray into both nostrils daily.  . Magnesium 250 MG TABS Take 1 tablet by mouth daily.  . metoprolol tartrate (LOPRESSOR) 50 MG tablet Take 50 mg by mouth 3 (three) times daily.  . nitroGLYCERIN (NITROSTAT) 0.4 MG SL tablet Place 0.4 mg under the tongue every 5 (five) minutes x 3 doses as needed for chest pain.  . Omega-3 Fatty Acids (FISH OIL PO) Take 1,000 mg by mouth daily.  . polyethylene glycol (MIRALAX / GLYCOLAX) packet Take 17 g by mouth daily.  . psyllium (METAMUCIL SMOOTH TEXTURE) 28 % packet Take 1 packet by mouth daily.  . ranitidine (ZANTAC) 300 MG tablet  Take 300 mg by mouth daily.  . tamsulosin (FLOMAX) 0.4 MG CAPS capsule Take 0.4 capsules by mouth daily.  Marland Kitchen testosterone cypionate (DEPOTESTOSTERONE CYPIONATE) 200 MG/ML injection Inject 0.4 ml twice monthly  . Zinc 50 MG TABS Take 1 tablet by mouth daily.     Allergies:   Azithromycin; Ciprofloxacin; Doxycycline; Flomax [tamsulosin hcl]; Levofloxacin; Penicillins; Ramipril; Sulfa antibiotics; Suprax [cefixime]; and Tramadol   Social History   Social History  . Marital status: Widowed    Spouse name: N/A  . Number of children: N/A  . Years of education: N/A   Social History Main Topics  . Smoking status: Never Smoker  . Smokeless tobacco: Never Used  . Alcohol use No  . Drug use: No  . Sexual activity: Not Asked   Other Topics Concern  . None   Social History Narrative  . None     Family History: The patient's family history includes Dementia in his mother.  ROS:   Please see the history of present illness.    All other systems reviewed and are negative.  EKGs/Labs/Other Studies Reviewed:    The following studies were reviewed today:EKG reveals sinus rhythm and nonspecific ST-T changes    Recent Labs: No results found for requested labs within last 8760 hours.  Recent Lipid Panel No results found for: CHOL, TRIG, HDL, CHOLHDL, VLDL, LDLCALC, LDLDIRECT  Physical Exam:    VS:  BP (!) 152/84   Pulse 64   Ht 5' (1.524 m)   Wt 207 lb (93.9 kg)   SpO2 98%   BMI 40.43 kg/m     Wt Readings from Last 3 Encounters:  07/19/17 207 lb (93.9 kg)     GEN: Patient is in no acute distress HEENT: Normal NECK: No JVD; No carotid bruits LYMPHATICS: No lymphadenopathy CARDIAC: S1 S2 regular, 2/6 systolic murmur at the apex. RESPIRATORY:  Clear to auscultation without rales, wheezing or rhonchi  ABDOMEN: Soft, non-tender, non-distended MUSCULOSKELETAL:  No edema; No deformity  SKIN: Warm and dry NEUROLOGIC:  Alert and oriented x 3 PSYCHIATRIC:  Normal affect     Signed, Jenean Lindau, MD  07/19/2017 10:34 AM    [place]

## 2017-08-01 ENCOUNTER — Ambulatory Visit: Payer: PPO | Admitting: Cardiology

## 2017-08-03 DIAGNOSIS — R233 Spontaneous ecchymoses: Secondary | ICD-10-CM | POA: Diagnosis not present

## 2017-08-03 DIAGNOSIS — Z6834 Body mass index (BMI) 34.0-34.9, adult: Secondary | ICD-10-CM | POA: Diagnosis not present

## 2017-08-03 DIAGNOSIS — I10 Essential (primary) hypertension: Secondary | ICD-10-CM | POA: Diagnosis not present

## 2017-08-03 DIAGNOSIS — R079 Chest pain, unspecified: Secondary | ICD-10-CM | POA: Diagnosis not present

## 2017-08-03 DIAGNOSIS — E291 Testicular hypofunction: Secondary | ICD-10-CM | POA: Diagnosis not present

## 2017-08-13 DIAGNOSIS — E291 Testicular hypofunction: Secondary | ICD-10-CM | POA: Diagnosis not present

## 2017-08-28 ENCOUNTER — Encounter: Payer: Self-pay | Admitting: Cardiology

## 2017-08-28 ENCOUNTER — Ambulatory Visit (INDEPENDENT_AMBULATORY_CARE_PROVIDER_SITE_OTHER): Payer: PPO | Admitting: Cardiology

## 2017-08-28 VITALS — BP 168/70 | HR 59 | Ht 67.0 in | Wt 207.0 lb

## 2017-08-28 DIAGNOSIS — I10 Essential (primary) hypertension: Secondary | ICD-10-CM | POA: Diagnosis not present

## 2017-08-28 NOTE — Patient Instructions (Signed)
Medication Instructions:   Your physician recommends that you continue on your current medications as directed. Please refer to the Current Medication list given to you today.   Labwork:  NONE  Testing/Procedures:  NONE  Follow-Up:  Your physician recommends that you schedule a follow-up appointment in: 6 months with Dr. Revankar.    Any Other Special Instructions Will Be Listed Below (If Applicable).     If you need a refill on your cardiac medications before your next appointment, please call your pharmacy.   

## 2017-08-28 NOTE — Progress Notes (Signed)
Cardiology Office Note:    Date:  08/28/2017   ID:  Jack Him., DOB September 24, 1946, MRN [id number]  PCP:  Jack Peer, MD  Cardiologist:  Jenean Lindau, MD   Referring MD: Jack Peer, MD    ASSESSMENT:    No diagnosis found. PLAN:    In order of problems listed above:  1. Primary prevention stressed to the patient. Importance of compliance with diet and medications stressed. His blood pressure is stable. Importance of regular exercise stressed. Weight reduction was stressed and he verbalized understands. Coronary angiography report was discussed with him at length. He'll be seen in follow-up appointment in 6 months or earlier if he has any concerns.   Medication Adjustments/Labs and Tests Ordered: Current medicines are reviewed at length with the patient today.  Concerns regarding medicines are outlined above.  No orders of the defined types were placed in this encounter.  No orders of the defined types were placed in this encounter.    Chief Complaint  Patient presents with  . Follow-up    Post Cath      History of Present Illness:    Jack Bott. is a 71 y.o. male . Patient was evaluated for angina-like symptoms stress test was abnormal and therefore he was sent for coronary angiography. His coronary angiography was unremarkable and subsequently stent finding. There is no chest pain orthopnea or PND. He takes care of activities of daily living. He is walking half half an hour on a regular basis without any symptoms. He is happy about this. He mentions to me that his blood pressure is elevated at doctors offices and he has an element of white coat hypertension. At the time of my evaluation is alert awake oriented and in no distress. His blood pressure generally runs in the range of 130/70 at home.  Past Medical History:  Diagnosis Date  . Acid reflux 07/18/2017  . Chest pain 07/18/2017  . Gout   . Hypertension     Past Surgical History:  Procedure  Laterality Date  . CHOLECYSTECTOMY    . LEFT HEART CATH AND CORONARY ANGIOGRAPHY N/A 07/31/2017   Procedure: LEFT HEART CATH AND CORONARY ANGIOGRAPHY;  Surgeon: Jack Man, MD;  Location: [facility];  Service: Cardiovascular;  Laterality: N/A;    Current Medications: Current Meds  Medication Sig  . allopurinol (ZYLOPRIM) 300 MG tablet Take 300 mg by mouth at bedtime.   Marland Kitchen amLODipine (NORVASC) 5 MG tablet Take 1 tablet (5 mg total) by mouth daily.  Marland Kitchen aspirin EC 81 MG tablet Take 81 mg by mouth daily.  Marland Kitchen azelastine (ASTELIN) 0.1 % nasal spray Place 1 spray into both nostrils 2 (two) times daily as needed for allergies.  . bumetanide (BUMEX) 0.5 MG tablet Take 0.5 mg by mouth daily.  . cycloSPORINE (RESTASIS) 0.05 % ophthalmic emulsion Place 1 drop into both eyes 2 (two) times daily.  Marland Kitchen esomeprazole (NEXIUM) 40 MG capsule Take 40 mg by mouth every morning.   . fluticasone (FLONASE) 50 MCG/ACT nasal spray Place 1 spray into both nostrils daily as needed for allergies.   . Magnesium 250 MG TABS Take 1 tablet by mouth daily.  . metoprolol tartrate (LOPRESSOR) 50 MG tablet Take 50 mg by mouth 3 (three) times daily.  . nitroGLYCERIN (NITROSTAT) 0.4 MG SL tablet Place 0.4 mg under the tongue every 5 (five) minutes x 3 doses as needed for chest pain.  . Omega-3 Fatty Acids (FISH  OIL PO) Take 1,000 mg by mouth daily.  . polyethylene glycol (MIRALAX / GLYCOLAX) packet Take by mouth daily. 1 tablespoon mixed with Benefiber daily every afternoon  . ranitidine (ZANTAC) 300 MG tablet Take 300 mg by mouth at bedtime.   . tamsulosin (FLOMAX) 0.4 MG CAPS capsule Take 0.4 capsules by mouth daily after supper.   . testosterone cypionate (DEPOTESTOSTERONE CYPIONATE) 200 MG/ML injection Inject 80 mg into the muscle See admin instructions. Inject 0.4 ml twice monthly  10th and 20th  . Wheat Dextrin (BENEFIBER) POWD Take 15 mLs by mouth daily. Mixed with Miralax  . Zinc 50 MG TABS Take 1 tablet by mouth  daily.     Allergies:   Azithromycin; Ciprofloxacin; Doxycycline; Levofloxacin; Penicillins; Ramipril; Sulfa antibiotics; and Suprax [cefixime]   Social History   Social History  . Marital status: Widowed    Spouse name: N/A  . Number of children: N/A  . Years of education: N/A   Social History Main Topics  . Smoking status: Never Smoker  . Smokeless tobacco: Never Used  . Alcohol use No  . Drug use: No  . Sexual activity: Not Asked   Other Topics Concern  . None   Social History Narrative  . None     Family History: The patient's family history includes Dementia in his mother.  ROS:   Please see the history of present illness.    All other systems reviewed and are negative.  EKGs/Labs/Other Studies Reviewed:    The following studies were reviewed today: Coronary angiography report 6 discussed with the patient at extensive length. Questions were answered to his satisfaction.   Recent Labs: 07/30/2017: ALT 17; BUN 14; Creatinine, Ser 1.05; Potassium 4.7; Sodium 142 07/31/2017: Hemoglobin 16.5; Platelets 192  Recent Lipid Panel    Component Value Date/Time   CHOL 157 07/19/2017 1116   TRIG 154 (H) 07/19/2017 1116   HDL 42 07/19/2017 1116   CHOLHDL 3.7 07/19/2017 1116   LDLCALC 84 07/19/2017 1116    Physical Exam:    VS:  BP (!) 168/70   Pulse (!) 59   Ht 5\' 7"  (1.702 m)   Wt 207 lb (93.9 kg)   SpO2 99%   BMI 32.42 kg/m     Wt Readings from Last 3 Encounters:  08/28/17 207 lb (93.9 kg)  07/31/17 206 lb (93.4 kg)  07/30/17 206 lb 1.9 oz (93.5 kg)     GEN: Patient is in no acute distress HEENT: Normal NECK: No JVD; No carotid bruits LYMPHATICS: No lymphadenopathy CARDIAC: Hear sounds regular, 2/6 systolic murmur at the apex. RESPIRATORY:  Clear to auscultation without rales, wheezing or rhonchi  ABDOMEN: Soft, non-tender, non-distended MUSCULOSKELETAL:  No edema; No deformity  SKIN: Warm and dry NEUROLOGIC:  Alert and oriented x 3 PSYCHIATRIC:   Normal affect   Signed, Jenean Lindau, MD  08/28/2017 10:54 AM    [place]

## 2017-09-03 DIAGNOSIS — E291 Testicular hypofunction: Secondary | ICD-10-CM | POA: Diagnosis not present

## 2017-09-11 DIAGNOSIS — R161 Splenomegaly, not elsewhere classified: Secondary | ICD-10-CM | POA: Diagnosis not present

## 2017-09-11 DIAGNOSIS — Z6834 Body mass index (BMI) 34.0-34.9, adult: Secondary | ICD-10-CM | POA: Diagnosis not present

## 2017-09-11 DIAGNOSIS — L959 Vasculitis limited to the skin, unspecified: Secondary | ICD-10-CM | POA: Diagnosis not present

## 2017-09-18 DIAGNOSIS — R161 Splenomegaly, not elsewhere classified: Secondary | ICD-10-CM | POA: Diagnosis not present

## 2017-09-18 DIAGNOSIS — K7689 Other specified diseases of liver: Secondary | ICD-10-CM | POA: Diagnosis not present

## 2017-09-27 DIAGNOSIS — K602 Anal fissure, unspecified: Secondary | ICD-10-CM | POA: Diagnosis not present

## 2017-09-27 DIAGNOSIS — R1013 Epigastric pain: Secondary | ICD-10-CM | POA: Diagnosis not present

## 2017-09-27 DIAGNOSIS — K30 Functional dyspepsia: Secondary | ICD-10-CM | POA: Diagnosis not present

## 2017-09-28 DIAGNOSIS — Z23 Encounter for immunization: Secondary | ICD-10-CM | POA: Diagnosis not present

## 2017-10-23 DIAGNOSIS — I10 Essential (primary) hypertension: Secondary | ICD-10-CM | POA: Diagnosis not present

## 2017-10-23 DIAGNOSIS — Z1211 Encounter for screening for malignant neoplasm of colon: Secondary | ICD-10-CM | POA: Diagnosis not present

## 2017-10-23 DIAGNOSIS — K5730 Diverticulosis of large intestine without perforation or abscess without bleeding: Secondary | ICD-10-CM | POA: Diagnosis not present

## 2017-10-23 DIAGNOSIS — D126 Benign neoplasm of colon, unspecified: Secondary | ICD-10-CM | POA: Diagnosis not present

## 2017-10-23 DIAGNOSIS — D122 Benign neoplasm of ascending colon: Secondary | ICD-10-CM | POA: Diagnosis not present

## 2017-10-23 DIAGNOSIS — K644 Residual hemorrhoidal skin tags: Secondary | ICD-10-CM | POA: Diagnosis not present

## 2017-10-23 DIAGNOSIS — K648 Other hemorrhoids: Secondary | ICD-10-CM | POA: Diagnosis not present

## 2017-10-23 DIAGNOSIS — Z79899 Other long term (current) drug therapy: Secondary | ICD-10-CM | POA: Diagnosis not present

## 2017-10-23 DIAGNOSIS — Z9049 Acquired absence of other specified parts of digestive tract: Secondary | ICD-10-CM | POA: Diagnosis not present

## 2017-10-23 DIAGNOSIS — N429 Disorder of prostate, unspecified: Secondary | ICD-10-CM | POA: Diagnosis not present

## 2017-10-23 DIAGNOSIS — Z86010 Personal history of colonic polyps: Secondary | ICD-10-CM | POA: Diagnosis not present

## 2017-11-23 ENCOUNTER — Other Ambulatory Visit: Payer: Self-pay | Admitting: Cardiology

## 2017-11-23 DIAGNOSIS — I10 Essential (primary) hypertension: Secondary | ICD-10-CM

## 2017-12-01 IMAGING — NM NM MISC PROCEDURE
3 series · 18 of 18 positions shown · non-contrast
Comparison: none

[Series 1: wbr_s-proj_st stress_(id)_sa · 6.5mm · 6.51mm/px · 6 of 64 frames shown (1 of 2)]
[frame 6/64]
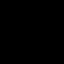
[frame 16/64]
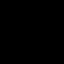
[frame 27/64]
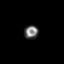
[frame 38/64]
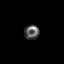
[frame 48/64]
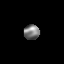
[frame 59/64]
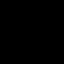

[Series 1: wbr_s-proj_st stress_(id)_sa · 6.5mm · 6.51mm/px · 6 of 512 frames shown (2 of 2)]
[frame 43/512]
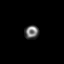
[frame 128/512]
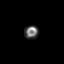
[frame 214/512]
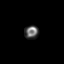
[frame 299/512]
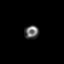
[frame 384/512]
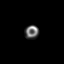
[frame 470/512]
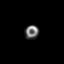

[Series 1: wbr_r-proj_st rest_(id)_sa · 6.5mm · 6.51mm/px · 6 of 64 frames shown]
[frame 6/64]
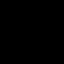
[frame 16/64]
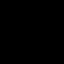
[frame 27/64]
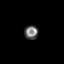
[frame 38/64]
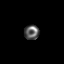
[frame 48/64]
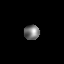
[frame 59/64]
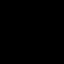

[18 of 18 positions shown; findings below may reference images not displayed]

Canned report from images found in remote index.

Refer to host system for actual result text.

## 2017-12-25 HISTORY — PX: CATARACT EXTRACTION: SUR2

## 2018-01-03 DIAGNOSIS — H34831 Tributary (branch) retinal vein occlusion, right eye, with macular edema: Secondary | ICD-10-CM | POA: Diagnosis not present

## 2018-01-14 DIAGNOSIS — Z1331 Encounter for screening for depression: Secondary | ICD-10-CM | POA: Diagnosis not present

## 2018-01-14 DIAGNOSIS — Z6835 Body mass index (BMI) 35.0-35.9, adult: Secondary | ICD-10-CM | POA: Diagnosis not present

## 2018-01-14 DIAGNOSIS — M5412 Radiculopathy, cervical region: Secondary | ICD-10-CM | POA: Diagnosis not present

## 2018-01-17 DIAGNOSIS — S8011XA Contusion of right lower leg, initial encounter: Secondary | ICD-10-CM | POA: Diagnosis not present

## 2018-01-22 DIAGNOSIS — H04123 Dry eye syndrome of bilateral lacrimal glands: Secondary | ICD-10-CM | POA: Diagnosis not present

## 2018-01-22 DIAGNOSIS — H34831 Tributary (branch) retinal vein occlusion, right eye, with macular edema: Secondary | ICD-10-CM | POA: Diagnosis not present

## 2018-01-22 DIAGNOSIS — H43811 Vitreous degeneration, right eye: Secondary | ICD-10-CM | POA: Diagnosis not present

## 2018-01-22 DIAGNOSIS — H401214 Low-tension glaucoma, right eye, indeterminate stage: Secondary | ICD-10-CM | POA: Diagnosis not present

## 2018-01-22 DIAGNOSIS — H2513 Age-related nuclear cataract, bilateral: Secondary | ICD-10-CM | POA: Diagnosis not present

## 2018-01-23 DIAGNOSIS — H2513 Age-related nuclear cataract, bilateral: Secondary | ICD-10-CM | POA: Diagnosis not present

## 2018-01-23 DIAGNOSIS — H04123 Dry eye syndrome of bilateral lacrimal glands: Secondary | ICD-10-CM | POA: Diagnosis not present

## 2018-01-23 DIAGNOSIS — H401214 Low-tension glaucoma, right eye, indeterminate stage: Secondary | ICD-10-CM | POA: Diagnosis not present

## 2018-01-23 DIAGNOSIS — H34831 Tributary (branch) retinal vein occlusion, right eye, with macular edema: Secondary | ICD-10-CM | POA: Diagnosis not present

## 2018-01-23 DIAGNOSIS — H43811 Vitreous degeneration, right eye: Secondary | ICD-10-CM | POA: Diagnosis not present

## 2018-01-31 DIAGNOSIS — N401 Enlarged prostate with lower urinary tract symptoms: Secondary | ICD-10-CM | POA: Diagnosis not present

## 2018-01-31 DIAGNOSIS — E291 Testicular hypofunction: Secondary | ICD-10-CM | POA: Diagnosis not present

## 2018-01-31 DIAGNOSIS — R102 Pelvic and perineal pain: Secondary | ICD-10-CM | POA: Diagnosis not present

## 2018-01-31 DIAGNOSIS — N411 Chronic prostatitis: Secondary | ICD-10-CM | POA: Diagnosis not present

## 2018-01-31 DIAGNOSIS — N5201 Erectile dysfunction due to arterial insufficiency: Secondary | ICD-10-CM | POA: Diagnosis not present

## 2018-02-11 DIAGNOSIS — R42 Dizziness and giddiness: Secondary | ICD-10-CM | POA: Diagnosis not present

## 2018-02-11 DIAGNOSIS — M5090 Cervical disc disorder, unspecified, unspecified cervical region: Secondary | ICD-10-CM | POA: Diagnosis not present

## 2018-02-11 DIAGNOSIS — Z6834 Body mass index (BMI) 34.0-34.9, adult: Secondary | ICD-10-CM | POA: Diagnosis not present

## 2018-02-22 DIAGNOSIS — J069 Acute upper respiratory infection, unspecified: Secondary | ICD-10-CM | POA: Diagnosis not present

## 2018-02-22 DIAGNOSIS — M19032 Primary osteoarthritis, left wrist: Secondary | ICD-10-CM | POA: Diagnosis not present

## 2018-03-05 DIAGNOSIS — M109 Gout, unspecified: Secondary | ICD-10-CM | POA: Diagnosis not present

## 2018-03-06 DIAGNOSIS — H04123 Dry eye syndrome of bilateral lacrimal glands: Secondary | ICD-10-CM | POA: Diagnosis not present

## 2018-03-06 DIAGNOSIS — H34831 Tributary (branch) retinal vein occlusion, right eye, with macular edema: Secondary | ICD-10-CM | POA: Diagnosis not present

## 2018-03-06 DIAGNOSIS — H25811 Combined forms of age-related cataract, right eye: Secondary | ICD-10-CM | POA: Diagnosis not present

## 2018-03-06 DIAGNOSIS — H401214 Low-tension glaucoma, right eye, indeterminate stage: Secondary | ICD-10-CM | POA: Diagnosis not present

## 2018-03-06 DIAGNOSIS — H43811 Vitreous degeneration, right eye: Secondary | ICD-10-CM | POA: Diagnosis not present

## 2018-03-22 DIAGNOSIS — R102 Pelvic and perineal pain: Secondary | ICD-10-CM | POA: Diagnosis not present

## 2018-03-22 DIAGNOSIS — N401 Enlarged prostate with lower urinary tract symptoms: Secondary | ICD-10-CM | POA: Diagnosis not present

## 2018-03-22 DIAGNOSIS — E291 Testicular hypofunction: Secondary | ICD-10-CM | POA: Diagnosis not present

## 2018-03-22 DIAGNOSIS — R351 Nocturia: Secondary | ICD-10-CM | POA: Diagnosis not present

## 2018-05-06 DIAGNOSIS — H401114 Primary open-angle glaucoma, right eye, indeterminate stage: Secondary | ICD-10-CM | POA: Diagnosis not present

## 2018-05-06 DIAGNOSIS — H25811 Combined forms of age-related cataract, right eye: Secondary | ICD-10-CM | POA: Diagnosis not present

## 2018-05-06 DIAGNOSIS — Z01818 Encounter for other preprocedural examination: Secondary | ICD-10-CM | POA: Diagnosis not present

## 2018-05-06 DIAGNOSIS — H25813 Combined forms of age-related cataract, bilateral: Secondary | ICD-10-CM | POA: Diagnosis not present

## 2018-05-14 DIAGNOSIS — H25811 Combined forms of age-related cataract, right eye: Secondary | ICD-10-CM | POA: Diagnosis not present

## 2018-05-16 DIAGNOSIS — B9689 Other specified bacterial agents as the cause of diseases classified elsewhere: Secondary | ICD-10-CM | POA: Diagnosis not present

## 2018-05-16 DIAGNOSIS — Z6834 Body mass index (BMI) 34.0-34.9, adult: Secondary | ICD-10-CM | POA: Diagnosis not present

## 2018-05-16 DIAGNOSIS — J028 Acute pharyngitis due to other specified organisms: Secondary | ICD-10-CM | POA: Diagnosis not present

## 2018-05-22 DIAGNOSIS — R1310 Dysphagia, unspecified: Secondary | ICD-10-CM | POA: Diagnosis not present

## 2018-05-22 DIAGNOSIS — Z6834 Body mass index (BMI) 34.0-34.9, adult: Secondary | ICD-10-CM | POA: Diagnosis not present

## 2018-06-11 DIAGNOSIS — S40812A Abrasion of left upper arm, initial encounter: Secondary | ICD-10-CM | POA: Diagnosis not present

## 2018-06-28 DIAGNOSIS — R1310 Dysphagia, unspecified: Secondary | ICD-10-CM | POA: Diagnosis not present

## 2018-06-28 DIAGNOSIS — J342 Deviated nasal septum: Secondary | ICD-10-CM | POA: Diagnosis not present

## 2018-06-28 DIAGNOSIS — R0989 Other specified symptoms and signs involving the circulatory and respiratory systems: Secondary | ICD-10-CM | POA: Diagnosis not present

## 2018-06-28 DIAGNOSIS — K219 Gastro-esophageal reflux disease without esophagitis: Secondary | ICD-10-CM | POA: Diagnosis not present

## 2018-07-01 ENCOUNTER — Encounter: Payer: Self-pay | Admitting: Cardiology

## 2018-07-01 ENCOUNTER — Ambulatory Visit (INDEPENDENT_AMBULATORY_CARE_PROVIDER_SITE_OTHER): Payer: PPO | Admitting: Cardiology

## 2018-07-01 VITALS — BP 150/76 | HR 62 | Ht 67.0 in | Wt 207.0 lb

## 2018-07-01 DIAGNOSIS — I10 Essential (primary) hypertension: Secondary | ICD-10-CM | POA: Diagnosis not present

## 2018-07-01 NOTE — Progress Notes (Signed)
Cardiology Office Note:    Date:  07/01/2018   ID:  Jack Him., DOB May 27, 1946, MRN [id number]  PCP:  Myer Peer, MD  Cardiologist:  Jenean Lindau, MD   Referring MD: Myer Peer, MD    ASSESSMENT:    1. Essential hypertension    PLAN:    In order of problems listed above:  1. Patient seems to be doing clinically well.  His effort tolerance is good. 2. I discussed with him about his blood pressure and he tells me that he has an element of whitecoat hypertension his blood pressures in the 130/70 range at home.  He checks it on a regular basis.  Diet was discussed for essential hypertension and being overweight.  Risks of abdominal obesity explained he vocalized understanding. 3. His lipids are followed by his primary care physician.  He takes a coated aspirin on a daily basis. 4. Patient will be seen in follow-up appointment in 9 months or earlier if the patient has any concerns    Medication Adjustments/Labs and Tests Ordered: Current medicines are reviewed at length with the patient today.  Concerns regarding medicines are outlined above.  No orders of the defined types were placed in this encounter.  No orders of the defined types were placed in this encounter.    No chief complaint on file.    History of Present Illness:    Jack Theiss. is a 72 y.o. male.  The patient had chest pain and shortness of breath and therefore was evaluated with an abnormal stress test.  He underwent coronary angiography with only luminal irregularities in his coronaries.  Subsequently is done fine.  No chest pain orthopnea or PND.  He mentions to me that he walks a mile in 15 minutes without any problems.  At the time of my evaluation, the patient is alert awake oriented and in no distress.  Past Medical History:  Diagnosis Date  . Acid reflux 07/18/2017  . Chest pain 07/18/2017  . Gout   . Hypertension     Past Surgical History:  Procedure Laterality Date  . CATARACT  EXTRACTION  2019  . CHOLECYSTECTOMY    . LEFT HEART CATH AND CORONARY ANGIOGRAPHY N/A 07/31/2017   Procedure: LEFT HEART CATH AND CORONARY ANGIOGRAPHY;  Surgeon: Leonie Man, MD;  Location: [facility];  Service: Cardiovascular;  Laterality: N/A;    Current Medications: Current Meds  Medication Sig  . allopurinol (ZYLOPRIM) 300 MG tablet Take 300 mg by mouth at bedtime.   Marland Kitchen amLODipine (NORVASC) 5 MG tablet TAKE 1 TABLET BY MOUTH ONCE DAILY.  Marland Kitchen aspirin EC 81 MG tablet Take 81 mg by mouth daily.  Marland Kitchen azelastine (ASTELIN) 0.1 % nasal spray Place 1 spray into both nostrils 2 (two) times daily as needed for allergies.  . bumetanide (BUMEX) 0.5 MG tablet Take 0.5 mg by mouth daily.  . clindamycin (CLEOCIN) 150 MG capsule Take 1 capsule by mouth 3 (three) times daily.   . cycloSPORINE (RESTASIS) 0.05 % ophthalmic emulsion Place 1 drop into both eyes 2 (two) times daily.  Marland Kitchen esomeprazole (NEXIUM) 40 MG capsule Take 40 mg by mouth every morning.   . fluticasone (FLONASE) 50 MCG/ACT nasal spray Place 1 spray into both nostrils daily as needed for allergies.   . IBU 600 MG tablet Take 1 tablet by mouth as needed.  . metoprolol tartrate (LOPRESSOR) 50 MG tablet Take 50 mg by mouth 3 (three) times daily.  Marland Kitchen  polyethylene glycol (MIRALAX / GLYCOLAX) packet Take by mouth daily. 1 tablespoon mixed with Benefiber daily every afternoon  . ranitidine (ZANTAC) 300 MG tablet Take 300 mg by mouth at bedtime.   . tamsulosin (FLOMAX) 0.4 MG CAPS capsule Take 0.4 capsules by mouth daily after supper.   . Wheat Dextrin (BENEFIBER) POWD Take 15 mLs by mouth daily. Mixed with Miralax  . Zinc 50 MG TABS Take 1 tablet by mouth daily.     Allergies:   Azithromycin; Ciprofloxacin; Doxycycline; Levofloxacin; Penicillins; Ramipril; Sulfa antibiotics; Suprax [cefixime]; and Hydrocortisone acetate   Social History   Socioeconomic History  . Marital status: Widowed    Spouse name: Not on file  . Number of  children: Not on file  . Years of education: Not on file  . Highest education level: Not on file  Occupational History  . Not on file  Social Needs  . Financial resource strain: Not on file  . Food insecurity:    Worry: Not on file    Inability: Not on file  . Transportation needs:    Medical: Not on file    Non-medical: Not on file  Tobacco Use  . Smoking status: Never Smoker  . Smokeless tobacco: Never Used  Substance and Sexual Activity  . Alcohol use: No  . Drug use: No  . Sexual activity: Not on file  Lifestyle  . Physical activity:    Days per week: Not on file    Minutes per session: Not on file  . Stress: Not on file  Relationships  . Social connections:    Talks on phone: Not on file    Gets together: Not on file    Attends religious service: Not on file    Active member of club or organization: Not on file    Attends meetings of clubs or organizations: Not on file    Relationship status: Not on file  Other Topics Concern  . Not on file  Social History Narrative  . Not on file     Family History: The patient's family history includes Dementia in his mother.  ROS:   Please see the history of present illness.    All other systems reviewed and are negative.  EKGs/Labs/Other Studies Reviewed:    The following studies were reviewed today: I discussed my findings with the patient at extensive length.   Recent Labs: 07/30/2017: ALT 17; BUN 14; Creatinine, Ser 1.05; Potassium 4.7; Sodium 142 07/31/2017: Hemoglobin 16.5; Platelets 192  Recent Lipid Panel    Component Value Date/Time   CHOL 157 07/19/2017 1116   TRIG 154 (H) 07/19/2017 1116   HDL 42 07/19/2017 1116   CHOLHDL 3.7 07/19/2017 1116   LDLCALC 84 07/19/2017 1116    Physical Exam:    VS:  BP (!) 150/76 (BP Location: Right Arm, Patient Position: Sitting, Cuff Size: Normal)   Pulse 62   Ht 5\' 7"  (1.702 m)   Wt 207 lb (93.9 kg)   SpO2 99%   BMI 32.42 kg/m     Wt Readings from Last 3  Encounters:  07/01/18 207 lb (93.9 kg)  08/28/17 207 lb (93.9 kg)  07/31/17 206 lb (93.4 kg)     GEN: Patient is in no acute distress HEENT: Normal NECK: No JVD; No carotid bruits LYMPHATICS: No lymphadenopathy CARDIAC: Hear sounds regular, 2/6 systolic murmur at the apex. RESPIRATORY:  Clear to auscultation without rales, wheezing or rhonchi  ABDOMEN: Soft, non-tender, non-distended MUSCULOSKELETAL:  No edema; No deformity  SKIN: Warm and dry NEUROLOGIC:  Alert and oriented x 3 PSYCHIATRIC:  Normal affect   Signed, Jenean Lindau, MD  07/01/2018 9:12 AM    [facility]

## 2018-07-01 NOTE — Patient Instructions (Signed)
Medication Instructions:  Your physician recommends that you continue on your current medications as directed. Please refer to the Current Medication list given to you today.  Labwork: None  Testing/Procedures: None  Follow-Up: Your physician recommends that you schedule a follow-up appointment in: 9 months  Any Other Special Instructions Will Be Listed Below (If Applicable).     If you need a refill on your cardiac medications before your next appointment, please call your pharmacy.   [place], RN, BSN

## 2018-07-22 DIAGNOSIS — H43811 Vitreous degeneration, right eye: Secondary | ICD-10-CM | POA: Diagnosis not present

## 2018-07-22 DIAGNOSIS — Z961 Presence of intraocular lens: Secondary | ICD-10-CM | POA: Diagnosis not present

## 2018-07-22 DIAGNOSIS — H34831 Tributary (branch) retinal vein occlusion, right eye, with macular edema: Secondary | ICD-10-CM | POA: Diagnosis not present

## 2018-07-22 DIAGNOSIS — H04123 Dry eye syndrome of bilateral lacrimal glands: Secondary | ICD-10-CM | POA: Diagnosis not present

## 2018-07-22 DIAGNOSIS — H524 Presbyopia: Secondary | ICD-10-CM | POA: Diagnosis not present

## 2018-07-22 DIAGNOSIS — H401214 Low-tension glaucoma, right eye, indeterminate stage: Secondary | ICD-10-CM | POA: Diagnosis not present

## 2018-08-12 DIAGNOSIS — Z6834 Body mass index (BMI) 34.0-34.9, adult: Secondary | ICD-10-CM | POA: Diagnosis not present

## 2018-08-12 DIAGNOSIS — K645 Perianal venous thrombosis: Secondary | ICD-10-CM | POA: Diagnosis not present

## 2018-08-27 DIAGNOSIS — H34831 Tributary (branch) retinal vein occlusion, right eye, with macular edema: Secondary | ICD-10-CM | POA: Diagnosis not present

## 2018-09-02 DIAGNOSIS — E291 Testicular hypofunction: Secondary | ICD-10-CM | POA: Diagnosis not present

## 2018-09-09 DIAGNOSIS — R102 Pelvic and perineal pain: Secondary | ICD-10-CM | POA: Diagnosis not present

## 2018-09-09 DIAGNOSIS — N5201 Erectile dysfunction due to arterial insufficiency: Secondary | ICD-10-CM | POA: Diagnosis not present

## 2018-09-09 DIAGNOSIS — R300 Dysuria: Secondary | ICD-10-CM | POA: Diagnosis not present

## 2018-09-12 DIAGNOSIS — Z23 Encounter for immunization: Secondary | ICD-10-CM | POA: Diagnosis not present

## 2018-10-07 DIAGNOSIS — D239 Other benign neoplasm of skin, unspecified: Secondary | ICD-10-CM | POA: Diagnosis not present

## 2018-10-07 DIAGNOSIS — Z0000 Encounter for general adult medical examination without abnormal findings: Secondary | ICD-10-CM | POA: Diagnosis not present

## 2018-10-21 DIAGNOSIS — L814 Other melanin hyperpigmentation: Secondary | ICD-10-CM | POA: Diagnosis not present

## 2018-10-21 DIAGNOSIS — L821 Other seborrheic keratosis: Secondary | ICD-10-CM | POA: Diagnosis not present

## 2018-11-04 DIAGNOSIS — B078 Other viral warts: Secondary | ICD-10-CM | POA: Diagnosis not present

## 2018-11-19 ENCOUNTER — Other Ambulatory Visit: Payer: Self-pay | Admitting: Cardiology

## 2018-11-19 DIAGNOSIS — I10 Essential (primary) hypertension: Secondary | ICD-10-CM

## 2018-11-29 DIAGNOSIS — Z1159 Encounter for screening for other viral diseases: Secondary | ICD-10-CM | POA: Diagnosis not present

## 2018-11-29 DIAGNOSIS — K219 Gastro-esophageal reflux disease without esophagitis: Secondary | ICD-10-CM | POA: Diagnosis not present

## 2018-11-29 DIAGNOSIS — M109 Gout, unspecified: Secondary | ICD-10-CM | POA: Diagnosis not present

## 2018-11-29 DIAGNOSIS — Z6834 Body mass index (BMI) 34.0-34.9, adult: Secondary | ICD-10-CM | POA: Diagnosis not present

## 2018-11-29 DIAGNOSIS — E782 Mixed hyperlipidemia: Secondary | ICD-10-CM | POA: Diagnosis not present

## 2018-11-29 DIAGNOSIS — Z79899 Other long term (current) drug therapy: Secondary | ICD-10-CM | POA: Diagnosis not present

## 2018-11-29 DIAGNOSIS — Z7689 Persons encountering health services in other specified circumstances: Secondary | ICD-10-CM | POA: Diagnosis not present

## 2019-01-17 DIAGNOSIS — L03039 Cellulitis of unspecified toe: Secondary | ICD-10-CM | POA: Diagnosis not present

## 2019-01-19 DIAGNOSIS — H348312 Tributary (branch) retinal vein occlusion, right eye, stable: Secondary | ICD-10-CM | POA: Diagnosis not present

## 2019-01-19 DIAGNOSIS — J069 Acute upper respiratory infection, unspecified: Secondary | ICD-10-CM | POA: Diagnosis not present

## 2019-01-19 DIAGNOSIS — H34831 Tributary (branch) retinal vein occlusion, right eye, with macular edema: Secondary | ICD-10-CM | POA: Diagnosis not present

## 2019-01-19 DIAGNOSIS — R05 Cough: Secondary | ICD-10-CM | POA: Diagnosis not present

## 2019-01-21 DIAGNOSIS — H348312 Tributary (branch) retinal vein occlusion, right eye, stable: Secondary | ICD-10-CM | POA: Diagnosis not present

## 2019-01-21 DIAGNOSIS — H34831 Tributary (branch) retinal vein occlusion, right eye, with macular edema: Secondary | ICD-10-CM | POA: Diagnosis not present

## 2019-03-06 DIAGNOSIS — N401 Enlarged prostate with lower urinary tract symptoms: Secondary | ICD-10-CM | POA: Diagnosis not present

## 2019-03-10 DIAGNOSIS — N401 Enlarged prostate with lower urinary tract symptoms: Secondary | ICD-10-CM | POA: Diagnosis not present

## 2019-03-10 DIAGNOSIS — K219 Gastro-esophageal reflux disease without esophagitis: Secondary | ICD-10-CM | POA: Diagnosis not present

## 2019-03-10 DIAGNOSIS — E782 Mixed hyperlipidemia: Secondary | ICD-10-CM | POA: Diagnosis not present

## 2019-03-10 DIAGNOSIS — M109 Gout, unspecified: Secondary | ICD-10-CM | POA: Diagnosis not present

## 2019-03-10 DIAGNOSIS — E291 Testicular hypofunction: Secondary | ICD-10-CM | POA: Diagnosis not present

## 2019-03-10 DIAGNOSIS — T466X5A Adverse effect of antihyperlipidemic and antiarteriosclerotic drugs, initial encounter: Secondary | ICD-10-CM | POA: Diagnosis not present

## 2019-03-10 DIAGNOSIS — E1169 Type 2 diabetes mellitus with other specified complication: Secondary | ICD-10-CM | POA: Diagnosis not present

## 2019-03-10 DIAGNOSIS — E785 Hyperlipidemia, unspecified: Secondary | ICD-10-CM | POA: Diagnosis not present

## 2019-03-10 DIAGNOSIS — N138 Other obstructive and reflux uropathy: Secondary | ICD-10-CM | POA: Diagnosis not present

## 2019-03-10 DIAGNOSIS — I10 Essential (primary) hypertension: Secondary | ICD-10-CM | POA: Diagnosis not present

## 2019-03-10 DIAGNOSIS — M791 Myalgia, unspecified site: Secondary | ICD-10-CM | POA: Diagnosis not present

## 2019-03-10 DIAGNOSIS — J309 Allergic rhinitis, unspecified: Secondary | ICD-10-CM | POA: Diagnosis not present

## 2019-03-13 DIAGNOSIS — R102 Pelvic and perineal pain: Secondary | ICD-10-CM | POA: Diagnosis not present

## 2019-03-13 DIAGNOSIS — N400 Enlarged prostate without lower urinary tract symptoms: Secondary | ICD-10-CM | POA: Diagnosis not present

## 2019-03-13 DIAGNOSIS — N5201 Erectile dysfunction due to arterial insufficiency: Secondary | ICD-10-CM | POA: Diagnosis not present

## 2019-03-13 DIAGNOSIS — E291 Testicular hypofunction: Secondary | ICD-10-CM | POA: Diagnosis not present

## 2019-03-17 ENCOUNTER — Telehealth: Payer: Self-pay | Admitting: Cardiology

## 2019-03-17 NOTE — Telephone Encounter (Signed)
° °  Primary Cardiologist:  Revankar  Patient contacted.  History reviewed.  No symptoms to suggest any unstable cardiac conditions.  Based on discussion, with current pandemic situation, we will be postponing this appointment for Jack Oconnor.  If symptoms change, he has been instructed to contact our office.    Calla Kicks  03/17/2019 3:32 PM         .

## 2019-04-09 ENCOUNTER — Telehealth: Payer: Self-pay | Admitting: Cardiology

## 2019-04-09 NOTE — Telephone Encounter (Signed)
Called patient to schedule VIRTUAL visit and he declined to schedule and states that he will not see any 3 of the physicians at the Practice and will go somewhere else for any cardiology needs.

## 2019-04-24 DIAGNOSIS — I10 Essential (primary) hypertension: Secondary | ICD-10-CM | POA: Diagnosis not present

## 2019-04-24 DIAGNOSIS — K219 Gastro-esophageal reflux disease without esophagitis: Secondary | ICD-10-CM | POA: Diagnosis not present

## 2019-05-12 ENCOUNTER — Ambulatory Visit: Payer: PPO | Admitting: Cardiology

## 2019-05-26 ENCOUNTER — Other Ambulatory Visit: Payer: Self-pay | Admitting: Cardiology

## 2019-05-26 DIAGNOSIS — I10 Essential (primary) hypertension: Secondary | ICD-10-CM

## 2019-05-27 ENCOUNTER — Other Ambulatory Visit: Payer: Self-pay | Admitting: Cardiology

## 2019-05-27 DIAGNOSIS — I10 Essential (primary) hypertension: Secondary | ICD-10-CM

## 2019-06-19 DIAGNOSIS — R0982 Postnasal drip: Secondary | ICD-10-CM | POA: Diagnosis not present

## 2019-06-19 DIAGNOSIS — Z20828 Contact with and (suspected) exposure to other viral communicable diseases: Secondary | ICD-10-CM | POA: Diagnosis not present

## 2019-06-19 DIAGNOSIS — J329 Chronic sinusitis, unspecified: Secondary | ICD-10-CM | POA: Diagnosis not present

## 2019-06-19 DIAGNOSIS — J309 Allergic rhinitis, unspecified: Secondary | ICD-10-CM | POA: Diagnosis not present

## 2019-09-12 DIAGNOSIS — Z23 Encounter for immunization: Secondary | ICD-10-CM | POA: Diagnosis not present

## 2019-09-30 DIAGNOSIS — H34831 Tributary (branch) retinal vein occlusion, right eye, with macular edema: Secondary | ICD-10-CM | POA: Diagnosis not present

## 2019-10-21 DIAGNOSIS — M791 Myalgia, unspecified site: Secondary | ICD-10-CM | POA: Diagnosis not present

## 2019-10-21 DIAGNOSIS — Z0000 Encounter for general adult medical examination without abnormal findings: Secondary | ICD-10-CM | POA: Diagnosis not present

## 2019-10-21 DIAGNOSIS — E782 Mixed hyperlipidemia: Secondary | ICD-10-CM | POA: Diagnosis not present

## 2019-10-21 DIAGNOSIS — N401 Enlarged prostate with lower urinary tract symptoms: Secondary | ICD-10-CM | POA: Diagnosis not present

## 2019-10-21 DIAGNOSIS — N138 Other obstructive and reflux uropathy: Secondary | ICD-10-CM | POA: Diagnosis not present

## 2019-10-21 DIAGNOSIS — M109 Gout, unspecified: Secondary | ICD-10-CM | POA: Diagnosis not present

## 2019-10-21 DIAGNOSIS — Z79899 Other long term (current) drug therapy: Secondary | ICD-10-CM | POA: Diagnosis not present

## 2019-10-21 DIAGNOSIS — I10 Essential (primary) hypertension: Secondary | ICD-10-CM | POA: Diagnosis not present

## 2019-10-21 DIAGNOSIS — E1169 Type 2 diabetes mellitus with other specified complication: Secondary | ICD-10-CM | POA: Diagnosis not present

## 2019-10-21 DIAGNOSIS — K219 Gastro-esophageal reflux disease without esophagitis: Secondary | ICD-10-CM | POA: Diagnosis not present

## 2019-10-21 DIAGNOSIS — T466X5A Adverse effect of antihyperlipidemic and antiarteriosclerotic drugs, initial encounter: Secondary | ICD-10-CM | POA: Diagnosis not present

## 2019-10-21 DIAGNOSIS — E785 Hyperlipidemia, unspecified: Secondary | ICD-10-CM | POA: Diagnosis not present

## 2019-11-11 DIAGNOSIS — H34831 Tributary (branch) retinal vein occlusion, right eye, with macular edema: Secondary | ICD-10-CM | POA: Diagnosis not present

## 2019-11-26 DIAGNOSIS — Z961 Presence of intraocular lens: Secondary | ICD-10-CM | POA: Diagnosis not present

## 2019-11-26 DIAGNOSIS — H04123 Dry eye syndrome of bilateral lacrimal glands: Secondary | ICD-10-CM | POA: Diagnosis not present

## 2019-11-26 DIAGNOSIS — H43811 Vitreous degeneration, right eye: Secondary | ICD-10-CM | POA: Diagnosis not present

## 2019-11-26 DIAGNOSIS — H401214 Low-tension glaucoma, right eye, indeterminate stage: Secondary | ICD-10-CM | POA: Diagnosis not present

## 2019-11-26 DIAGNOSIS — H34831 Tributary (branch) retinal vein occlusion, right eye, with macular edema: Secondary | ICD-10-CM | POA: Diagnosis not present

## 2019-11-26 DIAGNOSIS — H25812 Combined forms of age-related cataract, left eye: Secondary | ICD-10-CM | POA: Diagnosis not present

## 2019-12-26 DIAGNOSIS — I350 Nonrheumatic aortic (valve) stenosis: Secondary | ICD-10-CM

## 2019-12-26 HISTORY — DX: Nonrheumatic aortic (valve) stenosis: I35.0

## 2020-01-02 DIAGNOSIS — I10 Essential (primary) hypertension: Secondary | ICD-10-CM | POA: Diagnosis not present

## 2020-01-02 DIAGNOSIS — M109 Gout, unspecified: Secondary | ICD-10-CM | POA: Diagnosis not present

## 2020-01-02 DIAGNOSIS — E785 Hyperlipidemia, unspecified: Secondary | ICD-10-CM | POA: Diagnosis not present

## 2020-01-02 DIAGNOSIS — E1169 Type 2 diabetes mellitus with other specified complication: Secondary | ICD-10-CM | POA: Diagnosis not present

## 2020-01-12 DIAGNOSIS — B3781 Candidal esophagitis: Secondary | ICD-10-CM | POA: Diagnosis not present

## 2020-01-12 DIAGNOSIS — B370 Candidal stomatitis: Secondary | ICD-10-CM | POA: Diagnosis not present

## 2020-01-12 DIAGNOSIS — M109 Gout, unspecified: Secondary | ICD-10-CM | POA: Diagnosis not present

## 2020-02-11 DIAGNOSIS — R197 Diarrhea, unspecified: Secondary | ICD-10-CM | POA: Diagnosis not present

## 2020-02-11 DIAGNOSIS — R002 Palpitations: Secondary | ICD-10-CM | POA: Diagnosis not present

## 2020-02-11 DIAGNOSIS — R1110 Vomiting, unspecified: Secondary | ICD-10-CM | POA: Diagnosis not present

## 2020-02-11 DIAGNOSIS — I10 Essential (primary) hypertension: Secondary | ICD-10-CM | POA: Diagnosis not present

## 2020-02-11 DIAGNOSIS — R011 Cardiac murmur, unspecified: Secondary | ICD-10-CM | POA: Diagnosis not present

## 2020-02-13 DIAGNOSIS — I10 Essential (primary) hypertension: Secondary | ICD-10-CM | POA: Diagnosis not present

## 2020-02-13 DIAGNOSIS — E785 Hyperlipidemia, unspecified: Secondary | ICD-10-CM | POA: Diagnosis not present

## 2020-02-13 DIAGNOSIS — R011 Cardiac murmur, unspecified: Secondary | ICD-10-CM | POA: Diagnosis not present

## 2020-02-13 DIAGNOSIS — R002 Palpitations: Secondary | ICD-10-CM | POA: Diagnosis not present

## 2020-02-13 DIAGNOSIS — E1169 Type 2 diabetes mellitus with other specified complication: Secondary | ICD-10-CM | POA: Diagnosis not present

## 2020-02-18 DIAGNOSIS — R011 Cardiac murmur, unspecified: Secondary | ICD-10-CM | POA: Diagnosis not present

## 2020-02-18 DIAGNOSIS — I351 Nonrheumatic aortic (valve) insufficiency: Secondary | ICD-10-CM | POA: Diagnosis not present

## 2020-02-18 DIAGNOSIS — I517 Cardiomegaly: Secondary | ICD-10-CM | POA: Diagnosis not present

## 2020-02-18 DIAGNOSIS — I361 Nonrheumatic tricuspid (valve) insufficiency: Secondary | ICD-10-CM | POA: Diagnosis not present

## 2020-03-16 DIAGNOSIS — H34831 Tributary (branch) retinal vein occlusion, right eye, with macular edema: Secondary | ICD-10-CM | POA: Diagnosis not present

## 2020-04-23 DIAGNOSIS — D171 Benign lipomatous neoplasm of skin and subcutaneous tissue of trunk: Secondary | ICD-10-CM | POA: Diagnosis not present

## 2020-04-23 DIAGNOSIS — M791 Myalgia, unspecified site: Secondary | ICD-10-CM | POA: Diagnosis not present

## 2020-04-23 DIAGNOSIS — E782 Mixed hyperlipidemia: Secondary | ICD-10-CM | POA: Diagnosis not present

## 2020-04-23 DIAGNOSIS — E1169 Type 2 diabetes mellitus with other specified complication: Secondary | ICD-10-CM | POA: Diagnosis not present

## 2020-04-23 DIAGNOSIS — E785 Hyperlipidemia, unspecified: Secondary | ICD-10-CM | POA: Diagnosis not present

## 2020-06-02 DIAGNOSIS — M461 Sacroiliitis, not elsewhere classified: Secondary | ICD-10-CM | POA: Diagnosis not present

## 2020-06-02 DIAGNOSIS — L98491 Non-pressure chronic ulcer of skin of other sites limited to breakdown of skin: Secondary | ICD-10-CM | POA: Diagnosis not present

## 2020-06-24 DIAGNOSIS — M461 Sacroiliitis, not elsewhere classified: Secondary | ICD-10-CM | POA: Diagnosis not present

## 2020-06-24 DIAGNOSIS — E1169 Type 2 diabetes mellitus with other specified complication: Secondary | ICD-10-CM | POA: Diagnosis not present

## 2020-06-24 DIAGNOSIS — E785 Hyperlipidemia, unspecified: Secondary | ICD-10-CM | POA: Diagnosis not present

## 2020-06-24 DIAGNOSIS — L98491 Non-pressure chronic ulcer of skin of other sites limited to breakdown of skin: Secondary | ICD-10-CM | POA: Diagnosis not present

## 2020-07-05 DIAGNOSIS — H401214 Low-tension glaucoma, right eye, indeterminate stage: Secondary | ICD-10-CM | POA: Diagnosis not present

## 2020-07-05 DIAGNOSIS — Z961 Presence of intraocular lens: Secondary | ICD-10-CM | POA: Diagnosis not present

## 2020-10-15 DIAGNOSIS — K5732 Diverticulitis of large intestine without perforation or abscess without bleeding: Secondary | ICD-10-CM | POA: Diagnosis not present

## 2020-10-20 DIAGNOSIS — E1169 Type 2 diabetes mellitus with other specified complication: Secondary | ICD-10-CM | POA: Diagnosis not present

## 2020-10-20 DIAGNOSIS — E782 Mixed hyperlipidemia: Secondary | ICD-10-CM | POA: Diagnosis not present

## 2020-10-20 DIAGNOSIS — Z79899 Other long term (current) drug therapy: Secondary | ICD-10-CM | POA: Diagnosis not present

## 2020-11-02 DIAGNOSIS — H34831 Tributary (branch) retinal vein occlusion, right eye, with macular edema: Secondary | ICD-10-CM | POA: Diagnosis not present

## 2020-11-05 DIAGNOSIS — R3915 Urgency of urination: Secondary | ICD-10-CM | POA: Diagnosis not present

## 2020-11-09 DIAGNOSIS — E785 Hyperlipidemia, unspecified: Secondary | ICD-10-CM | POA: Diagnosis not present

## 2020-11-09 DIAGNOSIS — Z0000 Encounter for general adult medical examination without abnormal findings: Secondary | ICD-10-CM | POA: Diagnosis not present

## 2020-11-09 DIAGNOSIS — G720 Drug-induced myopathy: Secondary | ICD-10-CM | POA: Diagnosis not present

## 2020-11-09 DIAGNOSIS — E782 Mixed hyperlipidemia: Secondary | ICD-10-CM | POA: Diagnosis not present

## 2020-11-09 DIAGNOSIS — E1169 Type 2 diabetes mellitus with other specified complication: Secondary | ICD-10-CM | POA: Diagnosis not present

## 2020-11-16 DIAGNOSIS — H401214 Low-tension glaucoma, right eye, indeterminate stage: Secondary | ICD-10-CM | POA: Diagnosis not present

## 2020-11-16 DIAGNOSIS — H43811 Vitreous degeneration, right eye: Secondary | ICD-10-CM | POA: Diagnosis not present

## 2021-01-28 DIAGNOSIS — E782 Mixed hyperlipidemia: Secondary | ICD-10-CM | POA: Diagnosis not present

## 2021-01-28 DIAGNOSIS — E785 Hyperlipidemia, unspecified: Secondary | ICD-10-CM | POA: Diagnosis not present

## 2021-01-28 DIAGNOSIS — M461 Sacroiliitis, not elsewhere classified: Secondary | ICD-10-CM | POA: Diagnosis not present

## 2021-01-28 DIAGNOSIS — K5732 Diverticulitis of large intestine without perforation or abscess without bleeding: Secondary | ICD-10-CM | POA: Diagnosis not present

## 2021-01-28 DIAGNOSIS — I10 Essential (primary) hypertension: Secondary | ICD-10-CM | POA: Diagnosis not present

## 2021-01-28 DIAGNOSIS — E1169 Type 2 diabetes mellitus with other specified complication: Secondary | ICD-10-CM | POA: Diagnosis not present

## 2021-01-28 DIAGNOSIS — I70213 Atherosclerosis of native arteries of extremities with intermittent claudication, bilateral legs: Secondary | ICD-10-CM | POA: Diagnosis not present

## 2021-01-28 DIAGNOSIS — G720 Drug-induced myopathy: Secondary | ICD-10-CM | POA: Diagnosis not present

## 2021-02-02 DIAGNOSIS — H34831 Tributary (branch) retinal vein occlusion, right eye, with macular edema: Secondary | ICD-10-CM | POA: Diagnosis not present

## 2021-03-08 DIAGNOSIS — H34831 Tributary (branch) retinal vein occlusion, right eye, with macular edema: Secondary | ICD-10-CM | POA: Diagnosis not present

## 2021-03-23 DIAGNOSIS — H401214 Low-tension glaucoma, right eye, indeterminate stage: Secondary | ICD-10-CM | POA: Diagnosis not present

## 2021-03-23 DIAGNOSIS — Z961 Presence of intraocular lens: Secondary | ICD-10-CM | POA: Diagnosis not present

## 2021-03-23 DIAGNOSIS — H25812 Combined forms of age-related cataract, left eye: Secondary | ICD-10-CM | POA: Diagnosis not present

## 2021-03-23 DIAGNOSIS — H35032 Hypertensive retinopathy, left eye: Secondary | ICD-10-CM | POA: Diagnosis not present

## 2021-03-23 DIAGNOSIS — H43822 Vitreomacular adhesion, left eye: Secondary | ICD-10-CM | POA: Diagnosis not present

## 2021-05-02 DIAGNOSIS — M1A09X0 Idiopathic chronic gout, multiple sites, without tophus (tophi): Secondary | ICD-10-CM | POA: Diagnosis not present

## 2021-05-02 DIAGNOSIS — Z79899 Other long term (current) drug therapy: Secondary | ICD-10-CM | POA: Diagnosis not present

## 2021-05-02 DIAGNOSIS — E785 Hyperlipidemia, unspecified: Secondary | ICD-10-CM | POA: Diagnosis not present

## 2021-05-02 DIAGNOSIS — E1169 Type 2 diabetes mellitus with other specified complication: Secondary | ICD-10-CM | POA: Diagnosis not present

## 2021-05-02 DIAGNOSIS — E782 Mixed hyperlipidemia: Secondary | ICD-10-CM | POA: Diagnosis not present

## 2021-05-05 DIAGNOSIS — M7551 Bursitis of right shoulder: Secondary | ICD-10-CM | POA: Diagnosis not present

## 2021-05-09 DIAGNOSIS — G720 Drug-induced myopathy: Secondary | ICD-10-CM | POA: Diagnosis not present

## 2021-05-09 DIAGNOSIS — I10 Essential (primary) hypertension: Secondary | ICD-10-CM | POA: Diagnosis not present

## 2021-05-09 DIAGNOSIS — K2960 Other gastritis without bleeding: Secondary | ICD-10-CM | POA: Diagnosis not present

## 2021-05-09 DIAGNOSIS — E782 Mixed hyperlipidemia: Secondary | ICD-10-CM | POA: Diagnosis not present

## 2021-05-09 DIAGNOSIS — J301 Allergic rhinitis due to pollen: Secondary | ICD-10-CM | POA: Diagnosis not present

## 2021-05-09 DIAGNOSIS — E1169 Type 2 diabetes mellitus with other specified complication: Secondary | ICD-10-CM | POA: Diagnosis not present

## 2021-05-09 DIAGNOSIS — E785 Hyperlipidemia, unspecified: Secondary | ICD-10-CM | POA: Diagnosis not present

## 2021-05-09 DIAGNOSIS — I70213 Atherosclerosis of native arteries of extremities with intermittent claudication, bilateral legs: Secondary | ICD-10-CM | POA: Diagnosis not present

## 2021-05-09 DIAGNOSIS — H34831 Tributary (branch) retinal vein occlusion, right eye, with macular edema: Secondary | ICD-10-CM | POA: Diagnosis not present

## 2021-05-09 DIAGNOSIS — N138 Other obstructive and reflux uropathy: Secondary | ICD-10-CM | POA: Diagnosis not present

## 2021-05-09 DIAGNOSIS — I350 Nonrheumatic aortic (valve) stenosis: Secondary | ICD-10-CM | POA: Diagnosis not present

## 2021-05-09 DIAGNOSIS — Z23 Encounter for immunization: Secondary | ICD-10-CM | POA: Diagnosis not present

## 2021-05-12 DIAGNOSIS — R948 Abnormal results of function studies of other organs and systems: Secondary | ICD-10-CM | POA: Diagnosis not present

## 2021-05-16 DIAGNOSIS — R309 Painful micturition, unspecified: Secondary | ICD-10-CM | POA: Diagnosis not present

## 2021-05-16 DIAGNOSIS — K1379 Other lesions of oral mucosa: Secondary | ICD-10-CM | POA: Diagnosis not present

## 2021-05-19 DIAGNOSIS — R300 Dysuria: Secondary | ICD-10-CM | POA: Diagnosis not present

## 2021-07-13 DIAGNOSIS — H25812 Combined forms of age-related cataract, left eye: Secondary | ICD-10-CM | POA: Diagnosis not present

## 2021-07-13 DIAGNOSIS — H43822 Vitreomacular adhesion, left eye: Secondary | ICD-10-CM | POA: Diagnosis not present

## 2021-07-13 DIAGNOSIS — H401214 Low-tension glaucoma, right eye, indeterminate stage: Secondary | ICD-10-CM | POA: Diagnosis not present

## 2021-07-13 DIAGNOSIS — Z961 Presence of intraocular lens: Secondary | ICD-10-CM | POA: Diagnosis not present

## 2021-07-13 DIAGNOSIS — H35032 Hypertensive retinopathy, left eye: Secondary | ICD-10-CM | POA: Diagnosis not present

## 2021-08-15 DIAGNOSIS — E785 Hyperlipidemia, unspecified: Secondary | ICD-10-CM | POA: Diagnosis not present

## 2021-08-15 DIAGNOSIS — M7581 Other shoulder lesions, right shoulder: Secondary | ICD-10-CM | POA: Diagnosis not present

## 2021-08-15 DIAGNOSIS — E1169 Type 2 diabetes mellitus with other specified complication: Secondary | ICD-10-CM | POA: Diagnosis not present

## 2021-08-15 DIAGNOSIS — M7521 Bicipital tendinitis, right shoulder: Secondary | ICD-10-CM | POA: Diagnosis not present

## 2021-08-15 DIAGNOSIS — M7551 Bursitis of right shoulder: Secondary | ICD-10-CM | POA: Diagnosis not present

## 2021-08-21 DIAGNOSIS — R0981 Nasal congestion: Secondary | ICD-10-CM | POA: Diagnosis not present

## 2021-08-25 DIAGNOSIS — Z23 Encounter for immunization: Secondary | ICD-10-CM | POA: Diagnosis not present

## 2021-09-09 DIAGNOSIS — Z23 Encounter for immunization: Secondary | ICD-10-CM | POA: Diagnosis not present

## 2021-09-27 DIAGNOSIS — Z23 Encounter for immunization: Secondary | ICD-10-CM | POA: Diagnosis not present

## 2021-10-24 DIAGNOSIS — M7581 Other shoulder lesions, right shoulder: Secondary | ICD-10-CM | POA: Diagnosis not present

## 2021-10-24 DIAGNOSIS — E785 Hyperlipidemia, unspecified: Secondary | ICD-10-CM | POA: Diagnosis not present

## 2021-10-24 DIAGNOSIS — M7551 Bursitis of right shoulder: Secondary | ICD-10-CM | POA: Diagnosis not present

## 2021-10-24 DIAGNOSIS — E1169 Type 2 diabetes mellitus with other specified complication: Secondary | ICD-10-CM | POA: Diagnosis not present

## 2021-10-27 DIAGNOSIS — M75121 Complete rotator cuff tear or rupture of right shoulder, not specified as traumatic: Secondary | ICD-10-CM | POA: Diagnosis not present

## 2021-10-27 DIAGNOSIS — M7581 Other shoulder lesions, right shoulder: Secondary | ICD-10-CM | POA: Diagnosis not present

## 2021-11-01 DIAGNOSIS — B370 Candidal stomatitis: Secondary | ICD-10-CM | POA: Diagnosis not present

## 2021-11-01 DIAGNOSIS — R21 Rash and other nonspecific skin eruption: Secondary | ICD-10-CM | POA: Diagnosis not present

## 2021-11-03 DIAGNOSIS — Z0000 Encounter for general adult medical examination without abnormal findings: Secondary | ICD-10-CM | POA: Diagnosis not present

## 2021-11-03 DIAGNOSIS — E785 Hyperlipidemia, unspecified: Secondary | ICD-10-CM | POA: Diagnosis not present

## 2021-11-03 DIAGNOSIS — E1169 Type 2 diabetes mellitus with other specified complication: Secondary | ICD-10-CM | POA: Diagnosis not present

## 2021-11-03 DIAGNOSIS — Z79899 Other long term (current) drug therapy: Secondary | ICD-10-CM | POA: Diagnosis not present

## 2021-11-07 DIAGNOSIS — M25511 Pain in right shoulder: Secondary | ICD-10-CM | POA: Diagnosis not present

## 2021-11-10 DIAGNOSIS — M1030 Gout due to renal impairment, unspecified site: Secondary | ICD-10-CM | POA: Diagnosis not present

## 2021-11-10 DIAGNOSIS — I10 Essential (primary) hypertension: Secondary | ICD-10-CM | POA: Diagnosis not present

## 2021-11-10 DIAGNOSIS — I350 Nonrheumatic aortic (valve) stenosis: Secondary | ICD-10-CM | POA: Diagnosis not present

## 2021-11-10 DIAGNOSIS — Z0000 Encounter for general adult medical examination without abnormal findings: Secondary | ICD-10-CM | POA: Diagnosis not present

## 2021-11-10 DIAGNOSIS — I70213 Atherosclerosis of native arteries of extremities with intermittent claudication, bilateral legs: Secondary | ICD-10-CM | POA: Diagnosis not present

## 2021-11-10 DIAGNOSIS — E1169 Type 2 diabetes mellitus with other specified complication: Secondary | ICD-10-CM | POA: Diagnosis not present

## 2021-11-10 DIAGNOSIS — E785 Hyperlipidemia, unspecified: Secondary | ICD-10-CM | POA: Diagnosis not present

## 2021-11-10 DIAGNOSIS — K2960 Other gastritis without bleeding: Secondary | ICD-10-CM | POA: Diagnosis not present

## 2021-11-10 DIAGNOSIS — G720 Drug-induced myopathy: Secondary | ICD-10-CM | POA: Diagnosis not present

## 2021-11-10 DIAGNOSIS — M1A09X0 Idiopathic chronic gout, multiple sites, without tophus (tophi): Secondary | ICD-10-CM | POA: Diagnosis not present

## 2021-11-10 DIAGNOSIS — E782 Mixed hyperlipidemia: Secondary | ICD-10-CM | POA: Diagnosis not present

## 2021-11-11 DIAGNOSIS — E1169 Type 2 diabetes mellitus with other specified complication: Secondary | ICD-10-CM | POA: Diagnosis not present

## 2021-11-14 DIAGNOSIS — H43822 Vitreomacular adhesion, left eye: Secondary | ICD-10-CM | POA: Diagnosis not present

## 2021-11-14 DIAGNOSIS — H401214 Low-tension glaucoma, right eye, indeterminate stage: Secondary | ICD-10-CM | POA: Diagnosis not present

## 2021-11-14 DIAGNOSIS — H35032 Hypertensive retinopathy, left eye: Secondary | ICD-10-CM | POA: Diagnosis not present

## 2021-11-14 DIAGNOSIS — H25812 Combined forms of age-related cataract, left eye: Secondary | ICD-10-CM | POA: Diagnosis not present

## 2021-11-14 DIAGNOSIS — Z961 Presence of intraocular lens: Secondary | ICD-10-CM | POA: Diagnosis not present

## 2021-11-21 DIAGNOSIS — R300 Dysuria: Secondary | ICD-10-CM | POA: Diagnosis not present

## 2021-11-24 DIAGNOSIS — G8918 Other acute postprocedural pain: Secondary | ICD-10-CM | POA: Diagnosis not present

## 2021-11-24 DIAGNOSIS — M7551 Bursitis of right shoulder: Secondary | ICD-10-CM | POA: Diagnosis not present

## 2021-11-24 DIAGNOSIS — M24111 Other articular cartilage disorders, right shoulder: Secondary | ICD-10-CM | POA: Diagnosis not present

## 2021-11-24 DIAGNOSIS — S46811A Strain of other muscles, fascia and tendons at shoulder and upper arm level, right arm, initial encounter: Secondary | ICD-10-CM | POA: Diagnosis not present

## 2021-11-24 DIAGNOSIS — S46011A Strain of muscle(s) and tendon(s) of the rotator cuff of right shoulder, initial encounter: Secondary | ICD-10-CM | POA: Diagnosis not present

## 2021-11-24 DIAGNOSIS — M7521 Bicipital tendinitis, right shoulder: Secondary | ICD-10-CM | POA: Diagnosis not present

## 2021-11-24 DIAGNOSIS — M7541 Impingement syndrome of right shoulder: Secondary | ICD-10-CM | POA: Diagnosis not present

## 2021-11-28 DIAGNOSIS — H8190 Unspecified disorder of vestibular function, unspecified ear: Secondary | ICD-10-CM | POA: Diagnosis not present

## 2021-12-05 DIAGNOSIS — M24111 Other articular cartilage disorders, right shoulder: Secondary | ICD-10-CM | POA: Diagnosis not present

## 2021-12-08 DIAGNOSIS — M6281 Muscle weakness (generalized): Secondary | ICD-10-CM | POA: Diagnosis not present

## 2021-12-08 DIAGNOSIS — M25511 Pain in right shoulder: Secondary | ICD-10-CM | POA: Diagnosis not present

## 2021-12-14 DIAGNOSIS — M6281 Muscle weakness (generalized): Secondary | ICD-10-CM | POA: Diagnosis not present

## 2021-12-14 DIAGNOSIS — M25511 Pain in right shoulder: Secondary | ICD-10-CM | POA: Diagnosis not present

## 2021-12-21 DIAGNOSIS — M6281 Muscle weakness (generalized): Secondary | ICD-10-CM | POA: Diagnosis not present

## 2021-12-21 DIAGNOSIS — M25511 Pain in right shoulder: Secondary | ICD-10-CM | POA: Diagnosis not present

## 2021-12-28 DIAGNOSIS — M25511 Pain in right shoulder: Secondary | ICD-10-CM | POA: Diagnosis not present

## 2021-12-28 DIAGNOSIS — M6281 Muscle weakness (generalized): Secondary | ICD-10-CM | POA: Diagnosis not present

## 2021-12-30 DIAGNOSIS — R070 Pain in throat: Secondary | ICD-10-CM | POA: Diagnosis not present

## 2021-12-30 DIAGNOSIS — R0981 Nasal congestion: Secondary | ICD-10-CM | POA: Diagnosis not present

## 2021-12-30 DIAGNOSIS — J029 Acute pharyngitis, unspecified: Secondary | ICD-10-CM | POA: Diagnosis not present

## 2021-12-30 DIAGNOSIS — R509 Fever, unspecified: Secondary | ICD-10-CM | POA: Diagnosis not present

## 2022-01-04 DIAGNOSIS — M24111 Other articular cartilage disorders, right shoulder: Secondary | ICD-10-CM | POA: Diagnosis not present

## 2022-01-04 DIAGNOSIS — M25511 Pain in right shoulder: Secondary | ICD-10-CM | POA: Diagnosis not present

## 2022-01-04 DIAGNOSIS — M6281 Muscle weakness (generalized): Secondary | ICD-10-CM | POA: Diagnosis not present

## 2022-01-11 DIAGNOSIS — M25511 Pain in right shoulder: Secondary | ICD-10-CM | POA: Diagnosis not present

## 2022-01-11 DIAGNOSIS — M6281 Muscle weakness (generalized): Secondary | ICD-10-CM | POA: Diagnosis not present

## 2022-01-18 DIAGNOSIS — M25511 Pain in right shoulder: Secondary | ICD-10-CM | POA: Diagnosis not present

## 2022-01-18 DIAGNOSIS — M6281 Muscle weakness (generalized): Secondary | ICD-10-CM | POA: Diagnosis not present

## 2022-01-25 DIAGNOSIS — M25511 Pain in right shoulder: Secondary | ICD-10-CM | POA: Diagnosis not present

## 2022-01-25 DIAGNOSIS — M6281 Muscle weakness (generalized): Secondary | ICD-10-CM | POA: Diagnosis not present

## 2022-02-01 DIAGNOSIS — M6281 Muscle weakness (generalized): Secondary | ICD-10-CM | POA: Diagnosis not present

## 2022-02-01 DIAGNOSIS — M25511 Pain in right shoulder: Secondary | ICD-10-CM | POA: Diagnosis not present

## 2022-02-08 DIAGNOSIS — M6281 Muscle weakness (generalized): Secondary | ICD-10-CM | POA: Diagnosis not present

## 2022-02-08 DIAGNOSIS — M25511 Pain in right shoulder: Secondary | ICD-10-CM | POA: Diagnosis not present

## 2022-02-10 DIAGNOSIS — R310 Gross hematuria: Secondary | ICD-10-CM | POA: Diagnosis not present

## 2022-02-10 DIAGNOSIS — R8279 Other abnormal findings on microbiological examination of urine: Secondary | ICD-10-CM | POA: Diagnosis not present

## 2022-02-10 DIAGNOSIS — R300 Dysuria: Secondary | ICD-10-CM | POA: Diagnosis not present

## 2022-02-15 DIAGNOSIS — M24111 Other articular cartilage disorders, right shoulder: Secondary | ICD-10-CM | POA: Diagnosis not present

## 2022-02-16 DIAGNOSIS — M25511 Pain in right shoulder: Secondary | ICD-10-CM | POA: Diagnosis not present

## 2022-02-16 DIAGNOSIS — M6281 Muscle weakness (generalized): Secondary | ICD-10-CM | POA: Diagnosis not present

## 2022-02-20 DIAGNOSIS — K5792 Diverticulitis of intestine, part unspecified, without perforation or abscess without bleeding: Secondary | ICD-10-CM | POA: Diagnosis not present

## 2022-02-20 DIAGNOSIS — N138 Other obstructive and reflux uropathy: Secondary | ICD-10-CM | POA: Diagnosis not present

## 2022-02-22 DIAGNOSIS — M6281 Muscle weakness (generalized): Secondary | ICD-10-CM | POA: Diagnosis not present

## 2022-02-22 DIAGNOSIS — M25511 Pain in right shoulder: Secondary | ICD-10-CM | POA: Diagnosis not present

## 2022-02-27 DIAGNOSIS — R350 Frequency of micturition: Secondary | ICD-10-CM | POA: Diagnosis not present

## 2022-02-27 DIAGNOSIS — R310 Gross hematuria: Secondary | ICD-10-CM | POA: Diagnosis not present

## 2022-03-01 DIAGNOSIS — E871 Hypo-osmolality and hyponatremia: Secondary | ICD-10-CM | POA: Diagnosis not present

## 2022-03-01 DIAGNOSIS — M25511 Pain in right shoulder: Secondary | ICD-10-CM | POA: Diagnosis not present

## 2022-03-01 DIAGNOSIS — M6281 Muscle weakness (generalized): Secondary | ICD-10-CM | POA: Diagnosis not present

## 2022-03-08 DIAGNOSIS — M25511 Pain in right shoulder: Secondary | ICD-10-CM | POA: Diagnosis not present

## 2022-03-08 DIAGNOSIS — M6281 Muscle weakness (generalized): Secondary | ICD-10-CM | POA: Diagnosis not present

## 2022-03-13 DIAGNOSIS — A084 Viral intestinal infection, unspecified: Secondary | ICD-10-CM | POA: Diagnosis not present

## 2022-03-21 DIAGNOSIS — Z961 Presence of intraocular lens: Secondary | ICD-10-CM | POA: Diagnosis not present

## 2022-03-21 DIAGNOSIS — H43822 Vitreomacular adhesion, left eye: Secondary | ICD-10-CM | POA: Diagnosis not present

## 2022-03-21 DIAGNOSIS — H401214 Low-tension glaucoma, right eye, indeterminate stage: Secondary | ICD-10-CM | POA: Diagnosis not present

## 2022-03-21 DIAGNOSIS — H25812 Combined forms of age-related cataract, left eye: Secondary | ICD-10-CM | POA: Diagnosis not present

## 2022-03-21 DIAGNOSIS — H35032 Hypertensive retinopathy, left eye: Secondary | ICD-10-CM | POA: Diagnosis not present

## 2022-03-27 DIAGNOSIS — E1169 Type 2 diabetes mellitus with other specified complication: Secondary | ICD-10-CM | POA: Diagnosis not present

## 2022-03-27 DIAGNOSIS — I70213 Atherosclerosis of native arteries of extremities with intermittent claudication, bilateral legs: Secondary | ICD-10-CM | POA: Diagnosis not present

## 2022-03-27 DIAGNOSIS — L570 Actinic keratosis: Secondary | ICD-10-CM | POA: Diagnosis not present

## 2022-03-27 DIAGNOSIS — L820 Inflamed seborrheic keratosis: Secondary | ICD-10-CM | POA: Diagnosis not present

## 2022-03-27 DIAGNOSIS — E785 Hyperlipidemia, unspecified: Secondary | ICD-10-CM | POA: Diagnosis not present

## 2022-03-27 DIAGNOSIS — G720 Drug-induced myopathy: Secondary | ICD-10-CM | POA: Diagnosis not present

## 2022-03-27 DIAGNOSIS — E782 Mixed hyperlipidemia: Secondary | ICD-10-CM | POA: Diagnosis not present

## 2022-03-29 DIAGNOSIS — M24111 Other articular cartilage disorders, right shoulder: Secondary | ICD-10-CM | POA: Diagnosis not present

## 2022-04-25 DIAGNOSIS — Z79899 Other long term (current) drug therapy: Secondary | ICD-10-CM | POA: Diagnosis not present

## 2022-04-25 DIAGNOSIS — E785 Hyperlipidemia, unspecified: Secondary | ICD-10-CM | POA: Diagnosis not present

## 2022-04-25 DIAGNOSIS — E1169 Type 2 diabetes mellitus with other specified complication: Secondary | ICD-10-CM | POA: Diagnosis not present

## 2022-05-04 DIAGNOSIS — I10 Essential (primary) hypertension: Secondary | ICD-10-CM | POA: Diagnosis not present

## 2022-05-04 DIAGNOSIS — E782 Mixed hyperlipidemia: Secondary | ICD-10-CM | POA: Diagnosis not present

## 2022-05-04 DIAGNOSIS — M1A09X0 Idiopathic chronic gout, multiple sites, without tophus (tophi): Secondary | ICD-10-CM | POA: Diagnosis not present

## 2022-05-04 DIAGNOSIS — E1169 Type 2 diabetes mellitus with other specified complication: Secondary | ICD-10-CM | POA: Diagnosis not present

## 2022-05-04 DIAGNOSIS — E785 Hyperlipidemia, unspecified: Secondary | ICD-10-CM | POA: Diagnosis not present

## 2022-05-04 DIAGNOSIS — K2960 Other gastritis without bleeding: Secondary | ICD-10-CM | POA: Diagnosis not present

## 2022-05-04 DIAGNOSIS — G720 Drug-induced myopathy: Secondary | ICD-10-CM | POA: Diagnosis not present

## 2022-05-04 DIAGNOSIS — I70213 Atherosclerosis of native arteries of extremities with intermittent claudication, bilateral legs: Secondary | ICD-10-CM | POA: Diagnosis not present

## 2022-05-04 DIAGNOSIS — H34831 Tributary (branch) retinal vein occlusion, right eye, with macular edema: Secondary | ICD-10-CM | POA: Diagnosis not present

## 2022-05-04 DIAGNOSIS — I350 Nonrheumatic aortic (valve) stenosis: Secondary | ICD-10-CM | POA: Diagnosis not present

## 2022-05-04 DIAGNOSIS — M1030 Gout due to renal impairment, unspecified site: Secondary | ICD-10-CM | POA: Diagnosis not present

## 2022-05-04 DIAGNOSIS — Z0000 Encounter for general adult medical examination without abnormal findings: Secondary | ICD-10-CM | POA: Diagnosis not present

## 2022-05-15 DIAGNOSIS — D485 Neoplasm of uncertain behavior of skin: Secondary | ICD-10-CM | POA: Diagnosis not present

## 2022-05-15 DIAGNOSIS — D0439 Carcinoma in situ of skin of other parts of face: Secondary | ICD-10-CM | POA: Diagnosis not present

## 2022-06-14 DIAGNOSIS — K219 Gastro-esophageal reflux disease without esophagitis: Secondary | ICD-10-CM | POA: Diagnosis not present

## 2022-06-14 DIAGNOSIS — H8113 Benign paroxysmal vertigo, bilateral: Secondary | ICD-10-CM | POA: Diagnosis not present

## 2022-06-14 DIAGNOSIS — I10 Essential (primary) hypertension: Secondary | ICD-10-CM | POA: Diagnosis not present

## 2022-06-14 DIAGNOSIS — K649 Unspecified hemorrhoids: Secondary | ICD-10-CM | POA: Diagnosis not present

## 2022-06-14 DIAGNOSIS — K5900 Constipation, unspecified: Secondary | ICD-10-CM | POA: Diagnosis not present

## 2022-06-15 DIAGNOSIS — Z23 Encounter for immunization: Secondary | ICD-10-CM | POA: Diagnosis not present

## 2022-06-30 DIAGNOSIS — Z483 Aftercare following surgery for neoplasm: Secondary | ICD-10-CM | POA: Diagnosis not present

## 2022-06-30 DIAGNOSIS — H0289 Other specified disorders of eyelid: Secondary | ICD-10-CM | POA: Diagnosis not present

## 2022-06-30 DIAGNOSIS — L821 Other seborrheic keratosis: Secondary | ICD-10-CM | POA: Diagnosis not present

## 2022-06-30 DIAGNOSIS — Z01818 Encounter for other preprocedural examination: Secondary | ICD-10-CM | POA: Diagnosis not present

## 2022-06-30 DIAGNOSIS — L570 Actinic keratosis: Secondary | ICD-10-CM | POA: Diagnosis not present

## 2022-06-30 DIAGNOSIS — C44321 Squamous cell carcinoma of skin of nose: Secondary | ICD-10-CM | POA: Diagnosis not present

## 2022-06-30 DIAGNOSIS — Z85828 Personal history of other malignant neoplasm of skin: Secondary | ICD-10-CM | POA: Diagnosis not present

## 2022-06-30 DIAGNOSIS — Z481 Encounter for planned postprocedural wound closure: Secondary | ICD-10-CM | POA: Diagnosis not present

## 2022-06-30 DIAGNOSIS — I10 Essential (primary) hypertension: Secondary | ICD-10-CM | POA: Diagnosis not present

## 2022-06-30 DIAGNOSIS — D0439 Carcinoma in situ of skin of other parts of face: Secondary | ICD-10-CM | POA: Diagnosis not present

## 2022-06-30 DIAGNOSIS — C441222 Squamous cell carcinoma of skin of right lower eyelid, including canthus: Secondary | ICD-10-CM | POA: Diagnosis not present

## 2022-07-26 DIAGNOSIS — K219 Gastro-esophageal reflux disease without esophagitis: Secondary | ICD-10-CM | POA: Diagnosis not present

## 2022-07-26 DIAGNOSIS — K5900 Constipation, unspecified: Secondary | ICD-10-CM | POA: Diagnosis not present

## 2022-07-26 DIAGNOSIS — K649 Unspecified hemorrhoids: Secondary | ICD-10-CM | POA: Diagnosis not present

## 2022-07-27 DIAGNOSIS — H04123 Dry eye syndrome of bilateral lacrimal glands: Secondary | ICD-10-CM | POA: Diagnosis not present

## 2022-07-27 DIAGNOSIS — H25812 Combined forms of age-related cataract, left eye: Secondary | ICD-10-CM | POA: Diagnosis not present

## 2022-07-27 DIAGNOSIS — H35032 Hypertensive retinopathy, left eye: Secondary | ICD-10-CM | POA: Diagnosis not present

## 2022-07-27 DIAGNOSIS — Z961 Presence of intraocular lens: Secondary | ICD-10-CM | POA: Diagnosis not present

## 2022-07-27 DIAGNOSIS — H401214 Low-tension glaucoma, right eye, indeterminate stage: Secondary | ICD-10-CM | POA: Diagnosis not present

## 2022-07-27 DIAGNOSIS — H43822 Vitreomacular adhesion, left eye: Secondary | ICD-10-CM | POA: Diagnosis not present

## 2022-09-18 DIAGNOSIS — H4311 Vitreous hemorrhage, right eye: Secondary | ICD-10-CM | POA: Diagnosis not present

## 2022-09-19 DIAGNOSIS — H43811 Vitreous degeneration, right eye: Secondary | ICD-10-CM | POA: Diagnosis not present

## 2022-09-20 DIAGNOSIS — K5900 Constipation, unspecified: Secondary | ICD-10-CM | POA: Diagnosis not present

## 2022-09-20 DIAGNOSIS — K219 Gastro-esophageal reflux disease without esophagitis: Secondary | ICD-10-CM | POA: Diagnosis not present

## 2022-09-28 DIAGNOSIS — Z23 Encounter for immunization: Secondary | ICD-10-CM | POA: Diagnosis not present

## 2022-10-13 DIAGNOSIS — M109 Gout, unspecified: Secondary | ICD-10-CM | POA: Diagnosis not present

## 2022-10-13 DIAGNOSIS — E119 Type 2 diabetes mellitus without complications: Secondary | ICD-10-CM | POA: Diagnosis not present

## 2022-10-13 DIAGNOSIS — I10 Essential (primary) hypertension: Secondary | ICD-10-CM | POA: Diagnosis not present

## 2022-10-13 DIAGNOSIS — K648 Other hemorrhoids: Secondary | ICD-10-CM | POA: Diagnosis not present

## 2022-10-13 DIAGNOSIS — D124 Benign neoplasm of descending colon: Secondary | ICD-10-CM | POA: Diagnosis not present

## 2022-10-13 DIAGNOSIS — K635 Polyp of colon: Secondary | ICD-10-CM | POA: Diagnosis not present

## 2022-10-13 DIAGNOSIS — H409 Unspecified glaucoma: Secondary | ICD-10-CM | POA: Diagnosis not present

## 2022-10-13 DIAGNOSIS — Z882 Allergy status to sulfonamides status: Secondary | ICD-10-CM | POA: Diagnosis not present

## 2022-10-13 DIAGNOSIS — K644 Residual hemorrhoidal skin tags: Secondary | ICD-10-CM | POA: Diagnosis not present

## 2022-10-13 DIAGNOSIS — D126 Benign neoplasm of colon, unspecified: Secondary | ICD-10-CM | POA: Diagnosis not present

## 2022-10-13 DIAGNOSIS — Z86010 Personal history of colonic polyps: Secondary | ICD-10-CM | POA: Diagnosis not present

## 2022-10-13 DIAGNOSIS — K5730 Diverticulosis of large intestine without perforation or abscess without bleeding: Secondary | ICD-10-CM | POA: Diagnosis not present

## 2022-10-13 DIAGNOSIS — Z79899 Other long term (current) drug therapy: Secondary | ICD-10-CM | POA: Diagnosis not present

## 2022-10-13 DIAGNOSIS — Z888 Allergy status to other drugs, medicaments and biological substances status: Secondary | ICD-10-CM | POA: Diagnosis not present

## 2022-10-13 DIAGNOSIS — Z881 Allergy status to other antibiotic agents status: Secondary | ICD-10-CM | POA: Diagnosis not present

## 2022-10-13 DIAGNOSIS — Z7982 Long term (current) use of aspirin: Secondary | ICD-10-CM | POA: Diagnosis not present

## 2022-11-02 DIAGNOSIS — E1169 Type 2 diabetes mellitus with other specified complication: Secondary | ICD-10-CM | POA: Diagnosis not present

## 2022-11-02 DIAGNOSIS — E785 Hyperlipidemia, unspecified: Secondary | ICD-10-CM | POA: Diagnosis not present

## 2022-11-02 DIAGNOSIS — K130 Diseases of lips: Secondary | ICD-10-CM | POA: Diagnosis not present

## 2022-11-07 DIAGNOSIS — E782 Mixed hyperlipidemia: Secondary | ICD-10-CM | POA: Diagnosis not present

## 2022-11-07 DIAGNOSIS — M1A09X0 Idiopathic chronic gout, multiple sites, without tophus (tophi): Secondary | ICD-10-CM | POA: Diagnosis not present

## 2022-11-07 DIAGNOSIS — E1169 Type 2 diabetes mellitus with other specified complication: Secondary | ICD-10-CM | POA: Diagnosis not present

## 2022-11-07 DIAGNOSIS — Z79899 Other long term (current) drug therapy: Secondary | ICD-10-CM | POA: Diagnosis not present

## 2022-11-07 DIAGNOSIS — E785 Hyperlipidemia, unspecified: Secondary | ICD-10-CM | POA: Diagnosis not present

## 2022-11-13 DIAGNOSIS — G720 Drug-induced myopathy: Secondary | ICD-10-CM | POA: Diagnosis not present

## 2022-11-13 DIAGNOSIS — I10 Essential (primary) hypertension: Secondary | ICD-10-CM | POA: Diagnosis not present

## 2022-11-13 DIAGNOSIS — S39012A Strain of muscle, fascia and tendon of lower back, initial encounter: Secondary | ICD-10-CM | POA: Diagnosis not present

## 2022-11-13 DIAGNOSIS — I350 Nonrheumatic aortic (valve) stenosis: Secondary | ICD-10-CM | POA: Diagnosis not present

## 2022-11-13 DIAGNOSIS — E1169 Type 2 diabetes mellitus with other specified complication: Secondary | ICD-10-CM | POA: Diagnosis not present

## 2022-11-13 DIAGNOSIS — K2960 Other gastritis without bleeding: Secondary | ICD-10-CM | POA: Diagnosis not present

## 2022-11-13 DIAGNOSIS — E785 Hyperlipidemia, unspecified: Secondary | ICD-10-CM | POA: Diagnosis not present

## 2022-11-13 DIAGNOSIS — E782 Mixed hyperlipidemia: Secondary | ICD-10-CM | POA: Diagnosis not present

## 2022-11-13 DIAGNOSIS — M1A09X0 Idiopathic chronic gout, multiple sites, without tophus (tophi): Secondary | ICD-10-CM | POA: Diagnosis not present

## 2022-11-13 DIAGNOSIS — I70213 Atherosclerosis of native arteries of extremities with intermittent claudication, bilateral legs: Secondary | ICD-10-CM | POA: Diagnosis not present

## 2022-11-13 DIAGNOSIS — M1030 Gout due to renal impairment, unspecified site: Secondary | ICD-10-CM | POA: Diagnosis not present

## 2022-11-22 DIAGNOSIS — H43811 Vitreous degeneration, right eye: Secondary | ICD-10-CM | POA: Diagnosis not present

## 2022-11-23 DIAGNOSIS — R300 Dysuria: Secondary | ICD-10-CM | POA: Diagnosis not present

## 2022-11-23 DIAGNOSIS — E349 Endocrine disorder, unspecified: Secondary | ICD-10-CM | POA: Diagnosis not present

## 2023-01-29 DIAGNOSIS — M542 Cervicalgia: Secondary | ICD-10-CM | POA: Diagnosis not present

## 2023-01-29 DIAGNOSIS — E1169 Type 2 diabetes mellitus with other specified complication: Secondary | ICD-10-CM | POA: Diagnosis not present

## 2023-02-01 DIAGNOSIS — M5090 Cervical disc disorder, unspecified, unspecified cervical region: Secondary | ICD-10-CM | POA: Diagnosis not present

## 2023-02-01 DIAGNOSIS — M542 Cervicalgia: Secondary | ICD-10-CM | POA: Diagnosis not present

## 2023-02-12 DIAGNOSIS — E1169 Type 2 diabetes mellitus with other specified complication: Secondary | ICD-10-CM | POA: Diagnosis not present

## 2023-02-12 DIAGNOSIS — M1A09X0 Idiopathic chronic gout, multiple sites, without tophus (tophi): Secondary | ICD-10-CM | POA: Diagnosis not present

## 2023-02-12 DIAGNOSIS — E782 Mixed hyperlipidemia: Secondary | ICD-10-CM | POA: Diagnosis not present

## 2023-02-16 DIAGNOSIS — E782 Mixed hyperlipidemia: Secondary | ICD-10-CM | POA: Diagnosis not present

## 2023-02-16 DIAGNOSIS — G720 Drug-induced myopathy: Secondary | ICD-10-CM | POA: Diagnosis not present

## 2023-02-16 DIAGNOSIS — M5090 Cervical disc disorder, unspecified, unspecified cervical region: Secondary | ICD-10-CM | POA: Diagnosis not present

## 2023-02-16 DIAGNOSIS — E1169 Type 2 diabetes mellitus with other specified complication: Secondary | ICD-10-CM | POA: Diagnosis not present

## 2023-02-16 DIAGNOSIS — M62838 Other muscle spasm: Secondary | ICD-10-CM | POA: Diagnosis not present

## 2023-02-16 DIAGNOSIS — E785 Hyperlipidemia, unspecified: Secondary | ICD-10-CM | POA: Diagnosis not present

## 2023-02-16 DIAGNOSIS — I70213 Atherosclerosis of native arteries of extremities with intermittent claudication, bilateral legs: Secondary | ICD-10-CM | POA: Diagnosis not present

## 2023-02-16 DIAGNOSIS — I10 Essential (primary) hypertension: Secondary | ICD-10-CM | POA: Diagnosis not present

## 2023-03-21 DIAGNOSIS — M62838 Other muscle spasm: Secondary | ICD-10-CM | POA: Diagnosis not present

## 2023-03-21 DIAGNOSIS — E1169 Type 2 diabetes mellitus with other specified complication: Secondary | ICD-10-CM | POA: Diagnosis not present

## 2023-03-21 DIAGNOSIS — E785 Hyperlipidemia, unspecified: Secondary | ICD-10-CM | POA: Diagnosis not present

## 2023-03-21 DIAGNOSIS — M5090 Cervical disc disorder, unspecified, unspecified cervical region: Secondary | ICD-10-CM | POA: Diagnosis not present

## 2023-03-21 DIAGNOSIS — I10 Essential (primary) hypertension: Secondary | ICD-10-CM | POA: Diagnosis not present

## 2023-03-21 DIAGNOSIS — M12811 Other specific arthropathies, not elsewhere classified, right shoulder: Secondary | ICD-10-CM | POA: Diagnosis not present

## 2023-03-26 HISTORY — PX: OTHER SURGICAL HISTORY: SHX169

## 2023-04-06 DIAGNOSIS — M542 Cervicalgia: Secondary | ICD-10-CM | POA: Diagnosis not present

## 2023-04-06 DIAGNOSIS — M5090 Cervical disc disorder, unspecified, unspecified cervical region: Secondary | ICD-10-CM | POA: Diagnosis not present

## 2023-04-06 DIAGNOSIS — M47812 Spondylosis without myelopathy or radiculopathy, cervical region: Secondary | ICD-10-CM | POA: Diagnosis not present

## 2023-04-10 DIAGNOSIS — M47812 Spondylosis without myelopathy or radiculopathy, cervical region: Secondary | ICD-10-CM | POA: Diagnosis not present

## 2023-04-10 DIAGNOSIS — M542 Cervicalgia: Secondary | ICD-10-CM | POA: Diagnosis not present

## 2023-04-17 DIAGNOSIS — M5090 Cervical disc disorder, unspecified, unspecified cervical region: Secondary | ICD-10-CM | POA: Diagnosis not present

## 2023-04-17 DIAGNOSIS — I350 Nonrheumatic aortic (valve) stenosis: Secondary | ICD-10-CM | POA: Diagnosis not present

## 2023-04-17 DIAGNOSIS — R002 Palpitations: Secondary | ICD-10-CM | POA: Diagnosis not present

## 2023-04-17 DIAGNOSIS — R0989 Other specified symptoms and signs involving the circulatory and respiratory systems: Secondary | ICD-10-CM | POA: Diagnosis not present

## 2023-04-17 DIAGNOSIS — I10 Essential (primary) hypertension: Secondary | ICD-10-CM | POA: Diagnosis not present

## 2023-04-17 DIAGNOSIS — R0602 Shortness of breath: Secondary | ICD-10-CM | POA: Diagnosis not present

## 2023-04-20 DIAGNOSIS — I352 Nonrheumatic aortic (valve) stenosis with insufficiency: Secondary | ICD-10-CM | POA: Diagnosis not present

## 2023-04-20 DIAGNOSIS — R0602 Shortness of breath: Secondary | ICD-10-CM | POA: Diagnosis not present

## 2023-04-20 DIAGNOSIS — R002 Palpitations: Secondary | ICD-10-CM | POA: Diagnosis not present

## 2023-04-20 HISTORY — PX: TRANSTHORACIC ECHOCARDIOGRAM: SHX275

## 2023-05-08 DIAGNOSIS — R002 Palpitations: Secondary | ICD-10-CM | POA: Diagnosis not present

## 2023-05-10 DIAGNOSIS — M542 Cervicalgia: Secondary | ICD-10-CM | POA: Diagnosis not present

## 2023-05-11 DIAGNOSIS — M542 Cervicalgia: Secondary | ICD-10-CM | POA: Diagnosis not present

## 2023-05-14 DIAGNOSIS — H401114 Primary open-angle glaucoma, right eye, indeterminate stage: Secondary | ICD-10-CM | POA: Diagnosis not present

## 2023-05-14 DIAGNOSIS — H348111 Central retinal vein occlusion, right eye, with retinal neovascularization: Secondary | ICD-10-CM | POA: Diagnosis not present

## 2023-05-15 DIAGNOSIS — M542 Cervicalgia: Secondary | ICD-10-CM | POA: Diagnosis not present

## 2023-05-18 DIAGNOSIS — M542 Cervicalgia: Secondary | ICD-10-CM | POA: Diagnosis not present

## 2023-05-22 DIAGNOSIS — M542 Cervicalgia: Secondary | ICD-10-CM | POA: Diagnosis not present

## 2023-05-23 DIAGNOSIS — R300 Dysuria: Secondary | ICD-10-CM | POA: Diagnosis not present

## 2023-05-24 DIAGNOSIS — E785 Hyperlipidemia, unspecified: Secondary | ICD-10-CM | POA: Diagnosis not present

## 2023-05-24 DIAGNOSIS — M542 Cervicalgia: Secondary | ICD-10-CM | POA: Diagnosis not present

## 2023-05-24 DIAGNOSIS — E1169 Type 2 diabetes mellitus with other specified complication: Secondary | ICD-10-CM | POA: Diagnosis not present

## 2023-05-31 DIAGNOSIS — M1A09X0 Idiopathic chronic gout, multiple sites, without tophus (tophi): Secondary | ICD-10-CM | POA: Diagnosis not present

## 2023-05-31 DIAGNOSIS — E782 Mixed hyperlipidemia: Secondary | ICD-10-CM | POA: Diagnosis not present

## 2023-05-31 DIAGNOSIS — M1030 Gout due to renal impairment, unspecified site: Secondary | ICD-10-CM | POA: Diagnosis not present

## 2023-05-31 DIAGNOSIS — E1169 Type 2 diabetes mellitus with other specified complication: Secondary | ICD-10-CM | POA: Diagnosis not present

## 2023-05-31 DIAGNOSIS — K2960 Other gastritis without bleeding: Secondary | ICD-10-CM | POA: Diagnosis not present

## 2023-05-31 DIAGNOSIS — I10 Essential (primary) hypertension: Secondary | ICD-10-CM | POA: Diagnosis not present

## 2023-05-31 DIAGNOSIS — E785 Hyperlipidemia, unspecified: Secondary | ICD-10-CM | POA: Diagnosis not present

## 2023-05-31 DIAGNOSIS — G720 Drug-induced myopathy: Secondary | ICD-10-CM | POA: Diagnosis not present

## 2023-05-31 DIAGNOSIS — Z0000 Encounter for general adult medical examination without abnormal findings: Secondary | ICD-10-CM | POA: Diagnosis not present

## 2023-05-31 DIAGNOSIS — I491 Atrial premature depolarization: Secondary | ICD-10-CM | POA: Diagnosis not present

## 2023-05-31 DIAGNOSIS — I70213 Atherosclerosis of native arteries of extremities with intermittent claudication, bilateral legs: Secondary | ICD-10-CM | POA: Diagnosis not present

## 2023-05-31 DIAGNOSIS — M5090 Cervical disc disorder, unspecified, unspecified cervical region: Secondary | ICD-10-CM | POA: Diagnosis not present

## 2023-06-19 ENCOUNTER — Ambulatory Visit: Payer: Medicare Other | Attending: Cardiology | Admitting: Cardiology

## 2023-06-19 ENCOUNTER — Other Ambulatory Visit: Payer: Self-pay

## 2023-06-19 ENCOUNTER — Encounter: Payer: Self-pay | Admitting: Cardiology

## 2023-06-19 VITALS — BP 153/82 | HR 71 | Ht 67.0 in | Wt 210.2 lb

## 2023-06-19 DIAGNOSIS — I350 Nonrheumatic aortic (valve) stenosis: Secondary | ICD-10-CM | POA: Insufficient documentation

## 2023-06-19 DIAGNOSIS — R002 Palpitations: Secondary | ICD-10-CM | POA: Insufficient documentation

## 2023-06-19 DIAGNOSIS — I10 Essential (primary) hypertension: Secondary | ICD-10-CM

## 2023-06-19 MED ORDER — METOPROLOL TARTRATE 50 MG PO TABS: 75.0000 mg | ORAL_TABLET | Freq: Two times a day (BID) | ORAL | 3 refills | Status: AC

## 2023-06-19 NOTE — Progress Notes (Signed)
Primary Care Provider: Heywood Bene, MD [facility] Cardiologist: Jack Lemma, MD Electrophysiologist: None  Clinic Note: Chief Complaint  Patient presents with   New Patient (Initial Visit)    Returning back to cardiology for evaluation and management of aortic stenosis and palpitations    ===================================  ASSESSMENT/PLAN   Problem List Items Addressed This Visit       Cardiology Problems   Moderate aortic stenosis by prior echocardiography - Primary    Mean gradient 24 mmHg noted this year.  Would like to get an echo done in the [facility] system, but at this rate would probably wait at least another year.  We discussed concerning symptoms for aortic stenosis.      Relevant Medications   metoprolol tartrate (LOPRESSOR) 50 MG tablet   Essential hypertension (Chronic)    Blood pressure is pretty high today, but he seems a little bit "amped up ".  He was supposed to take his metoprolol 50 mg 3 times daily, moderate, but is only taking it twice daily.  This is along with 5 mg of amlodipine.  Would like to reassess his pressures medically.      Relevant Medications   metoprolol tartrate (LOPRESSOR) 50 MG tablet   Other Relevant Orders   EKG 12-Lead     Other   Palpitations    Palpitations or pretty benign ZIO PATCH.  He did have some PACs and PVCs but also some short relationship atrial burst or ectopic atrial rhythm.  Since he still notices forceful beats and we do have enough heart rate and blood pressure room.    Plan: Increase his metoprolol to 75 mg twice daily => and potentially up to 100 mg twice daily at next visit.  Close follow-up in about a month to reassess blood pressure and palpitations.  At that time we can schedule echocardiogram for next year.       ===================================  HPI:    @HCNARRATIVE  HISTORYBEGIN@ Jack Oconnor. is a 77 y.o. male who is being seen today for the evaluation of MODERATE AORTIC  STENOSIS, and ABNORMAL HEARTBEATS at the request of Jack Bene, MD.  Jack Oconnor. was last seen by Dr. Tomie Oconnor back in July 2019 => was doing well clinically.  Had some whitecoat hypertension.  No further episodes of chest pain or dyspnea.  Recent Hospitalizations: None @HCNARRATIVE  HISTORYEND@  He was seen by Jack Oconnor on April 17, 2023 for evaluation of elevated BP and fast heart rate. => Noted that he was just not feeling right that night.  Noted symptoms began about late 9 PM.  Heart rate went up from 89 to 99 bpm.  BP was 182/82, 185/81, 196/83 followed by improvement to 150/80 5 and 7 AM.  No strokelike symptoms of numbness or weakness.  No vision changes.  No headaches or dyspnea.  He felt his heart beating hard not irregularly.  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  Reviewed  CV studies:    The following studies were reviewed today: (if available, images/films reviewed: From Epic Chart or Care Everywhe .CA Myoview 07/25/2017: EF 61%.  Exercise 5: 31 min.  Reached 90% MPHR-7 METS stopped because of dyspnea.  No angina.  Horizontal ST depression of 1.5 mm noted in II, III, aVF, V4, V5 and V6 that returned to baseline after 5 to 9 minutes of recovery.  Normal EF of 65 to 60%. ?  Fixed, medium sized, mild basal to mid inferior perfusion defect suggestive of soft tissue attenuation. =>  Referred for cath because of chest pain, family history and the EKG changes  Cardiac Cath 07/31/2017: Angiographically minimal CAD.  EF 55 to 60%.  Mildly elevated BP.  Transthoracic Echo 04/20/2023: Normal LV size and function with EF 55 to 60%.  No RWMA.  Moderate AS (mean AVG 24, peak 40 mmHg), mild AI  ~12 Day [employer] Ambulatory Monitor : Predominant sinus rhythm with rate range 52-126 bpm, average 72 bpm. Occasional Ectopic Atrial Rhythm -> Reading physician felt this  was possibly the cause of patient's symptoms 9 short atrial burst 5-16 beats. => Longest was 16 beats (average 138 bpm, up to 160 bpm; fastest was 5 beats with an average of 143 bpm up to 151 bpm with normal Rare occasional PACs (1%), rare PVCs   Interval History:   Jack Oconnor. presents here today for evaluation as indicated, not aware of the aortic stenosis noted on echo.  Moderate AS noted on recent echo.  First he notes that he was feeling his heart "beating hard" and feeling it pulsing in his forehead.  He denies any irregular heartbeats or fast heart rates beyond the 110s.  He feels little bit weak when he has the irregular heartbeats but is also been somewhat immobilized by neck pain.  He denies any chest pain or shortness of breath associated with use of the irregular heartbeats or with either rest or exertion.  He had not heard or seen the results of his monitor or echo which we reviewed together.  He is not overly concerned about the irregular heartbeats now that is still the results of the monitor.  No associated lightheadedness dizziness or wooziness.  Just an unusual sensation.  No lightheadedness, dizziness or wooziness, no syncope or near syncope.  No TIA CVA or amaurosis fugax.  No claudication.   REVIEWED OF SYSTEMS   Review of Systems  Constitutional:  Negative for malaise/fatigue (Just somewhat deconditioned.  Having issues with neck arthritis) and weight loss.  HENT:  Negative for congestion.   Respiratory:  Positive for shortness of breath.   Gastrointestinal:  Negative for blood in stool and melena.  Genitourinary:  Negative for hematuria.  Musculoskeletal:  Positive for neck pain.  Neurological:  Positive for dizziness (Standing up to quickly). Negative for seizures and weakness.  Psychiatric/Behavioral: Negative.  Negative for hallucinations. The patient does not have insomnia.        Occasionally feels effects of stress    I have reviewed and (if needed)  personally updated the patient's problem list, medications, allergies, past medical and surgical history, social and family history.   PAST MEDICAL HISTORY   Past Medical History:  Diagnosis Date   Acid reflux 07/18/2017   Diabetes mellitus type II, controlled, with no complications [facility])    Not currently on medications   Erectile dysfunction    Gout    Hyperlipidemia    Hypertension    Moderate aortic stenosis by prior echocardiogram 2021   Transthoracic Echo 04/20/2023: Normal LV size and function with EF 55 to 60%.  No RWMA.  Moderate AS (mean AVG 24, peak 40 mmHg), mild AI   Retinal vein occlusion of right eye, unspecified retinal vein    Branch occlusion with macular edema-needed with coagulation and intravitreal the veg F.   Statin myopathy    Testicular hypofunction  PAST SURGICAL HISTORY   Past Surgical History:  Procedure Laterality Date   CATARACT EXTRACTION  2019   CHOLECYSTECTOMY     LEFT HEART CATH AND CORONARY ANGIOGRAPHY N/A 07/31/2017   Procedure: LEFT HEART CATH AND CORONARY ANGIOGRAPHY;  Surgeon: Marykay Lex, MD;  Location: [facility] INVASIVE CV LAB;  Service: Cardiovascular; ; angiographically minimal CAD.  EF 55 to 60%.  Mildly elevated LVEDP     There is no immunization history on file for this patient.  MEDICATIONS/ALLERGIES   Current Meds  Medication Sig   allopurinol (ZYLOPRIM) 300 MG tablet Take 300 mg by mouth at bedtime.    amLODipine (NORVASC) 5 MG tablet TAKE 1 TABLET BY MOUTH DAILY   aspirin EC 81 MG tablet Take 81 mg by mouth daily.   azelastine (ASTELIN) 0.1 % nasal spray Place 1 spray into both nostrils 2 (two) times daily as needed for allergies.   bumetanide (BUMEX) 0.5 MG tablet Take 0.5 mg by mouth daily.   cycloSPORINE (RESTASIS) 0.05 % ophthalmic emulsion Place 1 drop into both eyes 2 (two) times daily.   esomeprazole (NEXIUM) 40 MG capsule Take 40 mg by mouth every morning.    fluticasone (FLONASE) 50 MCG/ACT nasal spray Place 1  spray into both nostrils daily as needed for allergies.    IBU 600 MG tablet Take 1 tablet by mouth as needed.   polyethylene glycol (MIRALAX / GLYCOLAX) packet Take by mouth daily. 1 tablespoon mixed with Benefiber daily every afternoon   tamsulosin (FLOMAX) 0.4 MG CAPS capsule Take 0.4 capsules by mouth daily after supper.    Wheat Dextrin (BENEFIBER) POWD Take 15 mLs by mouth daily. Mixed with Miralax   Zinc 50 MG TABS Take 1 tablet by mouth daily.   []  metoprolol tartrate (LOPRESSOR) 50 MG tablet Take 50 mg by mouth 3 (three) times daily. => Currently only taking once daily    Allergies  Allergen Reactions   Azithromycin     Flushing    Ciprofloxacin     Passed out    Doxycycline Hives   Levofloxacin Other (See Comments)    Dizziness   Penicillins    Ramipril     Rapid pulse    Sulfa Antibiotics     Throat closing up   Suprax [Cefixime]     mouth ulcer    Hydrocortisone Acetate Rash    SOCIAL HISTORY/FAMILY HISTORY   Reviewed in Epic:   Social History   Tobacco Use   Smoking status: Never   Smokeless tobacco: Never  Vaping Use   Vaping Use: Never used  Substance Use Topics   Alcohol use: No   Drug use: No   Social History   Social History Narrative   Not on file   Family History  Problem Relation Age of Onset   Dementia Mother     OBJCTIVE -PE, EKG, labs   Wt Readings from Last 3 Encounters:  06/19/23 210 lb 3.2 oz (95.3 kg)  07/01/18 207 lb (93.9 kg)  08/28/17 207 lb (93.9 kg)    Physical Exam: BP (!) 153/82 (BP Location: Right Arm, Patient Position: Sitting, Cuff Size: Normal)   Pulse 71   Ht 5\' 7"  (1.702 m)   Wt 210 lb 3.2 oz (95.3 kg)   SpO2 99%   BMI 32.92 kg/m  Physical Exam Vitals reviewed.  Constitutional:      General: He is not in acute distress.    Appearance: Normal appearance. He is obese. He is not ill-appearing  or toxic-appearing.  HENT:     Head: Normocephalic and atraumatic.  Neck:     Vascular: No carotid bruit  (Radiated aortic murmur).  Cardiovascular:     Rate and Rhythm: Normal rate and regular rhythm.     Pulses: Normal pulses.     Heart sounds: Murmur (2/6 SEM at RUSB) heard.     No friction rub. No gallop.  Pulmonary:     Effort: Pulmonary effort is normal. No respiratory distress.     Breath sounds: Normal breath sounds. No wheezing, rhonchi or rales.  Chest:     Chest wall: No tenderness.  Abdominal:     General: Abdomen is flat. Bowel sounds are normal. There is no distension.     Palpations: Abdomen is soft. There is no mass (No HSM or bruit).     Tenderness: There is no abdominal tenderness.  Musculoskeletal:        General: No swelling. Normal range of motion.     Cervical back: Normal range of motion and neck supple.  Skin:    General: Skin is warm and dry.  Neurological:     General: No focal deficit present.     Mental Status: He is alert and oriented to person, place, and time.  Psychiatric:        Mood and Affect: Mood normal.        Behavior: Behavior normal.        Thought Content: Thought content normal.        Judgment: Judgment normal.      Adult ECG Report  Rate: 71 ;  Rhythm: normal sinus rhythm and LVH with nonspecific ST and ;   Narrative Interpretation: Borderline  Recent Labs:   02/12/2023: WBC 12.7, H/H15.8/46, PLT 266. Lab Results  Component Value Date   CHOL 157 07/19/2017   HDL 42 07/19/2017   LDLCALC 84 07/19/2017   TRIG 154 (H) 07/19/2017   CHOLHDL 3.7 07/19/2017   Lab Results  Component Value Date   CREATININE 1.05 07/30/2017   BUN 14 07/30/2017   NA 142 07/30/2017   K 4.7 07/30/2017   CL 99 07/30/2017   CO2 25 07/30/2017      Latest Ref Rng & Units 07/31/2017    5:51 AM  CBC  WBC 4.0 - 10.5 K/uL 7.6   Hemoglobin 13.0 - 17.0 g/dL 47.8   Hematocrit 29.5 - 52.0 % 49.0   Platelets 150 - 400 K/uL 192     No results found for: "HGBA1C" No results found for: "TSH"  ================================================== I spent a total  of 33 minutes with the patient spent in direct patient consultation.  Additional time spent with chart review  / charting (studies, outside notes, etc): 32 min Total Time: 65 min  Current medicines are reviewed at length with the patient today.  (+/- concerns) none  Notice: This dictation was prepared with Dragon dictation along with smart phrase technology. Any transcriptional errors that result from this process are unintentional and may not be corrected upon review.   Studies Ordered:  Orders Placed This Encounter  Procedures   EKG 12-Lead   Meds ordered this encounter  Medications   metoprolol tartrate (LOPRESSOR) 50 MG tablet    Sig: Take 1.5 tablets (75 mg total) by mouth 2 (two) times daily. May take an extra 25 mg as needed extended palp.    Dispense:  280 tablet    Refill:  3    Discontinue  previous dose.  Patient Instructions / Medication Changes & Studies & Tests Ordered   Patient Instructions  Medication Instructions:   Change - metoprolol tartrate 75 mg  ( 1 and 1/2 tablet of 50 mg ) twice a day    *If you need a refill on your cardiac medications before your next appointment, please call your pharmacy*   Lab Work: Not needed    Testing/Procedures:  Not needed  Follow-Up: At [facility], you and your health needs are our priority.  As part of our continuing mission to provide you with exceptional heart care, we have created designated Provider Care Teams.  These Care Teams include your primary Cardiologist (physician) and Advanced Practice Providers (APPs -  Physician Assistants and Nurse Practitioners) who all work together to provide you with the care you need, when you need it.     Your next appointment:   1 month(s)  The format for your next appointment:   Virtual Visit   Provider:   Bryan Lemma, MD        Marykay Lex, MD, MS Jack Oconnor, M.D., M.S. Interventional Cardiologist  [facility]  Pager #  [phone] Phone # [phone] [address] [place], [place] [address]   Thank you for choosing [facility] [place]!!

## 2023-06-19 NOTE — Patient Instructions (Addendum)
Medication Instructions:   Change - metoprolol tartrate 75 mg  ( 1 and 1/2 tablet of 50 mg ) twice a day    *If you need a refill on your cardiac medications before your next appointment, please call your pharmacy*   Lab Work: Not needed    Testing/Procedures:  Not needed  Follow-Up: At [facility], you and your health needs are our priority.  As part of our continuing mission to provide you with exceptional heart care, we have created designated Provider Care Teams.  These Care Teams include your primary Cardiologist (physician) and Advanced Practice Providers (APPs -  Physician Assistants and Nurse Practitioners) who all work together to provide you with the care you need, when you need it.     Your next appointment:   1 month(s)  The format for your next appointment:   Virtual Visit   Provider:   Bryan Lemma, MD

## 2023-06-23 ENCOUNTER — Encounter: Payer: Self-pay | Admitting: Cardiology

## 2023-06-23 NOTE — Assessment & Plan Note (Signed)
Blood pressure is pretty high today, but he seems a little bit "amped up ".  He was supposed to take his metoprolol 50 mg 3 times daily, moderate, but is only taking it twice daily.  This is along with 5 mg of amlodipine.  Would like to reassess his pressures medically.

## 2023-06-23 NOTE — Assessment & Plan Note (Addendum)
Palpitations or pretty benign ZIO PATCH.  He did have some PACs and PVCs but also some short relationship atrial burst or ectopic atrial rhythm.  Since he still notices forceful beats and we do have enough heart rate and blood pressure room.    Plan: Increase his metoprolol to 75 mg twice daily => and potentially up to 100 mg twice daily at next visit.  Close follow-up in about a month to reassess blood pressure and palpitations.  At that time we can schedule echocardiogram for next year.

## 2023-06-23 NOTE — Assessment & Plan Note (Signed)
Mean gradient 24 mmHg noted this year.  Would like to get an echo done in the [facility] system, but at this rate would probably wait at least another year.  We discussed concerning symptoms for aortic stenosis.

## 2023-08-07 ENCOUNTER — Telehealth: Payer: Self-pay

## 2023-08-07 ENCOUNTER — Ambulatory Visit: Payer: Medicare Other | Admitting: Cardiology

## 2023-08-07 ENCOUNTER — Encounter: Payer: Self-pay | Admitting: Cardiology

## 2023-08-07 VITALS — BP 138/72 | HR 63

## 2023-08-07 DIAGNOSIS — I10 Essential (primary) hypertension: Secondary | ICD-10-CM

## 2023-08-07 DIAGNOSIS — I350 Nonrheumatic aortic (valve) stenosis: Secondary | ICD-10-CM

## 2023-08-07 DIAGNOSIS — R002 Palpitations: Secondary | ICD-10-CM

## 2023-08-07 NOTE — Progress Notes (Signed)
Virtual Visit via Telephone Note   Because of Jack COLT Jr.'s co-morbid illnesses, he is at least at moderate risk for complications without adequate follow up.  This format is felt to be most appropriate for this patient at this time.  The patient did not have access to video technology/had technical difficulties with video requiring transitioning to audio format only (telephone).  All issues noted in this document were discussed and addressed.  No physical exam could be performed with this format.  Please refer to the patient's chart for his consent to telehealth for [facility].   Patient has given verbal permission to conduct this visit via virtual appointment and to bill insurance 08/07/2023 11:45 AM     Evaluation Performed:  Follow-up visit  Date:  08/07/2023   ID:  Jack Oconnor., DOB 06/18/1946, MRN [id number]  Patient Location: Home Provider Location: Office/Clinic  Date:  08/11/2023  ID:  Jack Oconnor., DOB 04-Dec-1946, MRN [id number] PCP: Street, Stephanie Coup, MD  [facility] Providers Cardiologist:  Bryan Lemma, MD     Chief Complaint  Patient presents with   Follow-up    Discussed test results   Palpitations    Much better    History of Present Illness: .     Jack Oconnor. is a  77 y.o. male  with a PMH notable for HTN, Moderate AS with symptomatic PACs and PVCs/atrial burst who presents here for 44-month virtual follow-up at the request of Heywood Bene, MD.  Jack Oconnor. was last seen on June 19, 2023 to review his monitor results as well as his echocardiogram showing persistent moderate AS with mean gradient of 24 mmHg.  Monitor showed symptomatic PACs and PVCs with short burst of atrial runs.  We increased his metoprolol to 75 mg twice daily.  Plan was for close follow-up to discuss symptoms with addition of beta-blocker, and to plan for follow-up echocardiogram in April 2025.    Subjective  INTERVAL HISTORY Jack Oconnor.  indicates that he says that his palpitation episodes have notably improved with the increased dose of metoprolol.  He has been otherwise stable, no prolonged rapid regular heartbeats.  No chest pain or pressure.  The forceful bleeding symptoms are still there a little bit, but much less prominent.  No prolonged tachycardia spells.  Occasional orthostatic dizziness but otherwise doing well.  ROS:  Cardiovascular ROS: no chest pain or dyspnea on exertion negative for - edema, irregular heartbeat, orthopnea, palpitations, paroxysmal nocturnal dyspnea, rapid heart rate, shortness of breath, or syncope/nar syncope; TIA/Amaurosis Fugax; claudication ; edema controlled with Bumex. Review of Systems -  overall negative - doing well. Heart burn controlled with PPI    Objective   Studies Reviewed: Marland Kitchen       Myoview 07/25/2017: EF 61%.  Exercise 5: 31 min.  Reached 90% MPHR-7 METS stopped because of dyspnea.  No angina.  Horizontal ST depression of 1.5 mm noted in II, III, aVF, V4, V5 and V6 that returned to baseline after 5 to 9 minutes of recovery.  Normal EF of 65 to 60%. ?  Fixed, medium sized, mild basal to mid inferior perfusion defect suggestive of soft tissue attenuation. =>  Referred for Cath because of chest pain, family history and the EKG changes  Cardiac Cath 07/31/2017: Angiographically minimal CAD.  EF 55 to 60%.  Mildly elevated BP.  Transthoracic Echo 04/20/2023: Normal LV size and function with EF 55  to 60%.  No RWMA.  Moderate AS (mean AVG 24, peak 40 mmHg), mild AI  ~12 Day [employer] Ambulatory Monitor : Predominant sinus rhythm with rate range 52-126 bpm, average 72 bpm. Occasional Ectopic Atrial Rhythm -> Reading physician felt this was possibly the cause of patient's symptoms 9 short atrial burst 5-16 beats. => Longest was 16 beats (average 138 bpm, up to 160 bpm; fastest was 5 beats with an average of 143 bpm up to 151 bpm with normal Rare occasional PACs (1%), rare PVCs    Risk Assessment/Calculations:            Physical Exam:   VS:  BP 138/72   Pulse 63    Wt Readings from Last 3 Encounters:  06/19/23 210 lb 3.2 oz (95.3 kg)  07/01/18 207 lb (93.9 kg)  08/28/17 207 lb (93.9 kg)    VITAL SIGNS:  reviewed GEN:  no acute distress RESPIRATORY:   non-labored NEURO:  alert and oriented x 3, no obvious focal deficit     ASSESSMENT AND PLAN: .    Problem List Items Addressed This Visit       Cardiology Problems   Moderate aortic stenosis by prior echocardiography (Chronic)    Mean AV gradient of 24 mmHg on Echo April 2024. => Due for follow-up echo in April 2025. Discussed concerning symptoms.      Relevant Orders   ECHOCARDIOGRAM COMPLETE   Essential hypertension - Primary (Chronic)    Borderline BP today, but stable.  Continue to monitor.  Low threshold to titrate beta-blocker or amlodipine.-Is only on 5 mg amlodipine, along with 75 mg twice daily metoprolol.        Other   Palpitations (Chronic)    Overall palpitations have notably improved since increasing beta-blocker dose.  As symptoms are stable, will hold off on titrating beta-blocker further.      Relevant Orders   ECHOCARDIOGRAM COMPLETE          Dispo: Return in about 8 months (around 04/06/2024) for 8 month follow-up with me-after echo.  Total time spent: 15 min spent with patient + 12 min spent charting = 27 min    Signed, Marykay Lex, MD, MS Bryan Lemma, M.D., M.S. Interventional Cardiologist  [facility]  Pager # [phone] Phone # [phone] [address] [place], [place] [address]

## 2023-08-07 NOTE — Patient Instructions (Signed)
Medication Instructions:  No changes   *If you need a refill on your cardiac medications before your next appointment, please call your pharmacy*   Lab Work: Not needed    Testing/Procedures:  Will be schedule in April 2025   Your physician has requested that you have an echocardiogram. Echocardiography is a painless test that uses sound waves to create images of your heart. It provides your doctor with information about the size and shape of your heart and how well your heart's chambers and valves are working. This procedure takes approximately one hour. There are no restrictions for this procedure. Please do NOT wear cologne, perfume, aftershave, or lotions (deodorant is allowed). Please arrive 15 minutes prior to your appointment time.   Follow-Up: At [facility], you and your health needs are our priority.  As part of our continuing mission to provide you with exceptional heart care, we have created designated Provider Care Teams.  These Care Teams include your primary Cardiologist (physician) and Advanced Practice Providers (APPs -  Physician Assistants and Nurse Practitioners) who all work together to provide you with the care you need, when you need it.     Your next appointment:   8 month(s) after echo is completed   The format for your next appointment:   In Person  Provider:   Bryan Lemma, MD    Other Instructions

## 2023-08-07 NOTE — Telephone Encounter (Signed)
..   Pt understands that although there may be some limitations with this type of visit, we will take all precautions to reduce any security or privacy concerns.  Pt understands that this will be treated like an in office visit and we will file with pt's insurance, and there may be a patient responsible charge related to this service. ? ?

## 2023-08-11 ENCOUNTER — Encounter: Payer: Self-pay | Admitting: Cardiology

## 2023-08-11 NOTE — Assessment & Plan Note (Signed)
Overall palpitations have notably improved since increasing beta-blocker dose.  As symptoms are stable, will hold off on titrating beta-blocker further.

## 2023-08-11 NOTE — Assessment & Plan Note (Addendum)
Borderline BP today, but stable.  Continue to monitor.  Low threshold to titrate beta-blocker or amlodipine.-Is only on 5 mg amlodipine, along with 75 mg twice daily metoprolol.

## 2023-08-11 NOTE — Assessment & Plan Note (Signed)
Mean AV gradient of 24 mmHg on Echo April 2024. => Due for follow-up echo in April 2025. Discussed concerning symptoms.

## 2023-08-14 DIAGNOSIS — E1169 Type 2 diabetes mellitus with other specified complication: Secondary | ICD-10-CM | POA: Diagnosis not present

## 2023-08-14 DIAGNOSIS — E785 Hyperlipidemia, unspecified: Secondary | ICD-10-CM | POA: Diagnosis not present

## 2023-08-14 DIAGNOSIS — I872 Venous insufficiency (chronic) (peripheral): Secondary | ICD-10-CM | POA: Diagnosis not present

## 2023-08-14 DIAGNOSIS — I70213 Atherosclerosis of native arteries of extremities with intermittent claudication, bilateral legs: Secondary | ICD-10-CM | POA: Diagnosis not present

## 2023-08-14 DIAGNOSIS — L570 Actinic keratosis: Secondary | ICD-10-CM | POA: Diagnosis not present

## 2023-08-29 DIAGNOSIS — E1169 Type 2 diabetes mellitus with other specified complication: Secondary | ICD-10-CM | POA: Diagnosis not present

## 2023-08-29 DIAGNOSIS — M1A09X0 Idiopathic chronic gout, multiple sites, without tophus (tophi): Secondary | ICD-10-CM | POA: Diagnosis not present

## 2023-08-29 DIAGNOSIS — E785 Hyperlipidemia, unspecified: Secondary | ICD-10-CM | POA: Diagnosis not present

## 2023-08-29 DIAGNOSIS — E782 Mixed hyperlipidemia: Secondary | ICD-10-CM | POA: Diagnosis not present

## 2023-09-05 DIAGNOSIS — I70213 Atherosclerosis of native arteries of extremities with intermittent claudication, bilateral legs: Secondary | ICD-10-CM | POA: Diagnosis not present

## 2023-09-05 DIAGNOSIS — M1A09X0 Idiopathic chronic gout, multiple sites, without tophus (tophi): Secondary | ICD-10-CM | POA: Diagnosis not present

## 2023-09-05 DIAGNOSIS — E782 Mixed hyperlipidemia: Secondary | ICD-10-CM | POA: Diagnosis not present

## 2023-09-05 DIAGNOSIS — G720 Drug-induced myopathy: Secondary | ICD-10-CM | POA: Diagnosis not present

## 2023-09-05 DIAGNOSIS — E1169 Type 2 diabetes mellitus with other specified complication: Secondary | ICD-10-CM | POA: Diagnosis not present

## 2023-09-05 DIAGNOSIS — E785 Hyperlipidemia, unspecified: Secondary | ICD-10-CM | POA: Diagnosis not present

## 2023-09-05 DIAGNOSIS — M1030 Gout due to renal impairment, unspecified site: Secondary | ICD-10-CM | POA: Diagnosis not present

## 2023-09-05 DIAGNOSIS — I10 Essential (primary) hypertension: Secondary | ICD-10-CM | POA: Diagnosis not present

## 2023-09-05 DIAGNOSIS — Z23 Encounter for immunization: Secondary | ICD-10-CM | POA: Diagnosis not present

## 2023-09-05 DIAGNOSIS — I872 Venous insufficiency (chronic) (peripheral): Secondary | ICD-10-CM | POA: Diagnosis not present

## 2023-09-06 DIAGNOSIS — Z23 Encounter for immunization: Secondary | ICD-10-CM | POA: Diagnosis not present

## 2023-10-05 DIAGNOSIS — E1169 Type 2 diabetes mellitus with other specified complication: Secondary | ICD-10-CM | POA: Diagnosis not present

## 2023-10-05 DIAGNOSIS — M62830 Muscle spasm of back: Secondary | ICD-10-CM | POA: Diagnosis not present

## 2023-10-05 DIAGNOSIS — E785 Hyperlipidemia, unspecified: Secondary | ICD-10-CM | POA: Diagnosis not present

## 2023-10-05 DIAGNOSIS — S39012A Strain of muscle, fascia and tendon of lower back, initial encounter: Secondary | ICD-10-CM | POA: Diagnosis not present

## 2023-11-13 DIAGNOSIS — E785 Hyperlipidemia, unspecified: Secondary | ICD-10-CM | POA: Diagnosis not present

## 2023-11-13 DIAGNOSIS — J029 Acute pharyngitis, unspecified: Secondary | ICD-10-CM | POA: Diagnosis not present

## 2023-11-13 DIAGNOSIS — B370 Candidal stomatitis: Secondary | ICD-10-CM | POA: Diagnosis not present

## 2023-11-13 DIAGNOSIS — B3781 Candidal esophagitis: Secondary | ICD-10-CM | POA: Diagnosis not present

## 2023-11-13 DIAGNOSIS — E1169 Type 2 diabetes mellitus with other specified complication: Secondary | ICD-10-CM | POA: Diagnosis not present

## 2023-11-26 DIAGNOSIS — Z23 Encounter for immunization: Secondary | ICD-10-CM | POA: Diagnosis not present

## 2023-12-12 DIAGNOSIS — D485 Neoplasm of uncertain behavior of skin: Secondary | ICD-10-CM | POA: Diagnosis not present

## 2023-12-12 DIAGNOSIS — D22111 Melanocytic nevi of right upper eyelid, including canthus: Secondary | ICD-10-CM | POA: Diagnosis not present

## 2023-12-28 DIAGNOSIS — M1A09X0 Idiopathic chronic gout, multiple sites, without tophus (tophi): Secondary | ICD-10-CM | POA: Diagnosis not present

## 2023-12-28 DIAGNOSIS — E782 Mixed hyperlipidemia: Secondary | ICD-10-CM | POA: Diagnosis not present

## 2023-12-28 DIAGNOSIS — Z79899 Other long term (current) drug therapy: Secondary | ICD-10-CM | POA: Diagnosis not present

## 2023-12-28 DIAGNOSIS — E785 Hyperlipidemia, unspecified: Secondary | ICD-10-CM | POA: Diagnosis not present

## 2023-12-28 DIAGNOSIS — E1169 Type 2 diabetes mellitus with other specified complication: Secondary | ICD-10-CM | POA: Diagnosis not present

## 2024-01-02 DIAGNOSIS — E1169 Type 2 diabetes mellitus with other specified complication: Secondary | ICD-10-CM | POA: Diagnosis not present

## 2024-01-02 DIAGNOSIS — I10 Essential (primary) hypertension: Secondary | ICD-10-CM | POA: Diagnosis not present

## 2024-01-02 DIAGNOSIS — N138 Other obstructive and reflux uropathy: Secondary | ICD-10-CM | POA: Diagnosis not present

## 2024-01-02 DIAGNOSIS — M1030 Gout due to renal impairment, unspecified site: Secondary | ICD-10-CM | POA: Diagnosis not present

## 2024-01-02 DIAGNOSIS — E785 Hyperlipidemia, unspecified: Secondary | ICD-10-CM | POA: Diagnosis not present

## 2024-01-02 DIAGNOSIS — G720 Drug-induced myopathy: Secondary | ICD-10-CM | POA: Diagnosis not present

## 2024-01-02 DIAGNOSIS — E782 Mixed hyperlipidemia: Secondary | ICD-10-CM | POA: Diagnosis not present

## 2024-01-02 DIAGNOSIS — M1A09X0 Idiopathic chronic gout, multiple sites, without tophus (tophi): Secondary | ICD-10-CM | POA: Diagnosis not present

## 2024-01-02 DIAGNOSIS — I70213 Atherosclerosis of native arteries of extremities with intermittent claudication, bilateral legs: Secondary | ICD-10-CM | POA: Diagnosis not present

## 2024-02-11 DIAGNOSIS — H401113 Primary open-angle glaucoma, right eye, severe stage: Secondary | ICD-10-CM | POA: Diagnosis not present

## 2024-03-21 ENCOUNTER — Ambulatory Visit (HOSPITAL_COMMUNITY): Attending: Cardiology

## 2024-03-21 DIAGNOSIS — R002 Palpitations: Secondary | ICD-10-CM | POA: Insufficient documentation

## 2024-03-21 DIAGNOSIS — I350 Nonrheumatic aortic (valve) stenosis: Secondary | ICD-10-CM | POA: Diagnosis not present

## 2024-03-21 HISTORY — PX: TRANSTHORACIC ECHOCARDIOGRAM: SHX275

## 2024-03-21 LAB — ECHOCARDIOGRAM COMPLETE
AR max vel: 1.16 cm2
AV Area VTI: 1.06 cm2
AV Area mean vel: 0.98 cm2
AV Mean grad: 29 mmHg
AV Peak grad: 49.2 mmHg
Ao pk vel: 3.51 m/s
Area-P 1/2: 4.31 cm2
S' Lateral: 3.1 cm

## 2024-03-23 ENCOUNTER — Encounter: Payer: Self-pay | Admitting: Cardiology

## 2024-04-11 ENCOUNTER — Ambulatory Visit: Payer: Medicare Other | Attending: Cardiology | Admitting: Cardiology

## 2024-04-11 ENCOUNTER — Encounter: Payer: Self-pay | Admitting: Cardiology

## 2024-04-11 VITALS — BP 120/74 | HR 66 | Ht 67.0 in | Wt 206.0 lb

## 2024-04-11 DIAGNOSIS — I350 Nonrheumatic aortic (valve) stenosis: Secondary | ICD-10-CM | POA: Diagnosis not present

## 2024-04-11 DIAGNOSIS — E1169 Type 2 diabetes mellitus with other specified complication: Secondary | ICD-10-CM | POA: Diagnosis not present

## 2024-04-11 DIAGNOSIS — E785 Hyperlipidemia, unspecified: Secondary | ICD-10-CM

## 2024-04-11 DIAGNOSIS — R002 Palpitations: Secondary | ICD-10-CM

## 2024-04-11 DIAGNOSIS — I10 Essential (primary) hypertension: Secondary | ICD-10-CM | POA: Diagnosis not present

## 2024-04-11 NOTE — Patient Instructions (Addendum)
 Medication Instructions:  No changes  *If you need a refill on your cardiac medications before your next appointment, please call your pharmacy*   Lab Work: Not needed    Testing/Procedures:  Will be schedule  at [address] Your physician has requested that you have an echocardiogram. Echocardiography is a painless test that uses sound waves to create images of your heart. It provides your doctor with information about the size and shape of your heart and how well your heart's chambers and valves are working. This procedure takes approximately one hour. There are no restrictions for this procedure. Please do NOT wear cologne, perfume, aftershave, or lotions (deodorant is allowed). Please arrive 15 minutes prior to your appointment time.  Please note: We ask at that you not bring children with you during ultrasound (echo/ vascular) testing. Due to room size and safety concerns, children are not allowed in the ultrasound rooms during exams. Our front office staff cannot provide observation of children in our lobby area while testing is being conducted. An adult accompanying a patient to their appointment will only be allowed in the ultrasound room at the discretion of the ultrasound technician under special circumstances. We apologize for any inconvenience.    Follow-Up: At [facility], you and your health needs are our priority.  As part of our continuing mission to provide you with exceptional heart care, we have created designated Provider Care Teams.  These Care Teams include your primary Cardiologist (physician) and Advanced Practice Providers (APPs -  Physician Assistants and Nurse Practitioners) who all work together to provide you with the care you need, when you need it.     Your next appointment:   12 month(s)  The format for your next appointment:   In Person  Provider:   Randene Bustard, MD    Other Instructions

## 2024-04-11 NOTE — Assessment & Plan Note (Signed)
 Palpitations seem to be better controlled.  He remains on the 75 mg twice daily metoprolol  they have a little fatigue.  If this is a problem he can reduce his daytime dose to 50 mg and continue 20 5 at night.  Take additional 25 if he has palpitations.

## 2024-04-11 NOTE — Progress Notes (Signed)
 Cardiology Office Note:  .   Date:  04/11/2024  ID:  Jack Scale., DOB October 09, 1946, MRN [id number] PCP/Referring Provider: Audrea Blender, Renford Cartwright, MD  [facility] Providers Cardiologist:  Randene Bustard, MD     Chief Complaint  Patient presents with   Follow-up    Early annual follow-up   Cardiac Valve Problem    Aortic stenosis.  No symptoms echo just done.    Patient Profile: .     Jack Rafalski. is a 78 y.o. male with a PMH notable for HTN, Moderate AS with symptomatic PACs and PVCs/atrial burst who presents here for early annual follow-up.   He returns at the request of [address] Renford Cartwright, *.  Recently seen at the request of Rella Cardinal, MD  Jack Scale. was seen on June 19, 2023 to review his monitor results as well as his echocardiogram showing persistent moderate AS with mean gradient of 24 mmHg.  Monitor showed symptomatic PACs and PVCs with short burst of atrial runs.  We increased his metoprolol  to 75 mg twice daily.  Plan was for close follow-up to discuss symptoms with addition of beta-blocker, and to plan for follow-up echocardiogram in April 2025.    Jack Scale. was last seen on June 19, 2023 felt some hard heartbeats but just a sensation of pulsation in his forehead but no real palpitations.  No chest pain.  We discussed results of the study.  He noted being deconditioned and have some exertional dyspnea as well as some orthostatic dizziness if he stands up too quick. => We change his metoprolol  to 75 mg twice daily, and saw him in virtual follow-up on August 13. Noting improvement of his palpitations.  Echocardiogram ordered.  Subjective  Discussed the use of AI scribe software for clinical note transcription with the patient, who gave verbal consent to proceed.  History of Present Illness History of Present Illness Jack Diguglielmo. is a 78 year old male with moderate aortic stenosis who presents for an eight-month follow-up after recent  echocardiogram.. He remains stable from a cardiac standpoint.  He denies palpitations, chest pain, pressure, or tightness. He is currently taking metoprolol  at one and a half tablets twice a day but notes feeling slightly more tired than usual. Cardiovascular ROS: no chest pain or dyspnea on exertion positive for - mild exercise intolerance and easy fatigue but otherwise stable. negative for - irregular heartbeat, orthopnea, palpitations, paroxysmal nocturnal dyspnea, rapid heart rate, shortness of breath, or lightheadedness, dizziness or wooziness, syncope or near syncope, TIA or RCVS, claudication  He is on several medications including Bumex 0.5 mg daily, which he sometimes skips due to inconvenience, Nexium 40 mg daily, Flonase 50 mcg twice daily, and Flomax nightly. No significant swelling or shortness of breath when lying flat, and no episodes of heart racing, dizziness, or chest heaviness during physical activity.  His cholesterol levels were discussed, with a total cholesterol of 203, triglycerides of 161, HDL of 37, and LDL of 83. He is not currently on any cholesterol-lowering medication. He remains physically active, engaging in activities such as walking and cutting wood without experiencing symptoms.  He mentions that apparently his PCP advised him to discontinue testosterone  therapy, though the reason was not specified. Recent lab results showed an A1c of 7.8, indicating elevated blood sugar levels, but he is not on any medication for diabetes, only dietary management. His hemoglobin was 15.0, which he was told was high, leading to  a suggestion to donate blood, but he felt unwell afterward.   Objective   Pertinent Meds  Medication Sig   allopurinol (ZYLOPRIM) 300 MG tablet Take 300 mg by mouth at bedtime.    amLODipine  (NORVASC ) 5 MG tablet TAKE 1 TABLET BY MOUTH DAILY   aspirin  EC 81 MG tablet Take 81 mg by mouth daily.   bumetanide (BUMEX) 0.5 MG tablet Take 0.5 mg by mouth daily.    esomeprazole (NEXIUM) 40 MG capsule Take 40 mg by mouth every morning.    fluticasone (FLONASE) 50 MCG/ACT nasal spray Place 1 spray into both nostrils daily as needed for allergies.    IBU 600 MG tablet Take 1 tablet by mouth as needed.   tamsulosin (FLOMAX) 0.4 MG CAPS capsule Take 0.4 capsules by mouth daily after supper.    Wheat Dextrin (BENEFIBER) POWD Take 15 mLs by mouth daily. Mixed with Miralax   Studies Reviewed: Jack Oconnor       LABS Hb: 15.0 (12/28/2023) WBC: 8.4 (12/28/2023) PLT: 245 (12/28/2023) Cr: 1.1 (12/28/2023) Glucose: 154 (12/28/2023) A1c: 7.8 (12/2023) K: 3.9 (12/28/2023) Total Cholesterol: 203 (12/28/2023) Triglycerides: 161 (12/28/2023) HDL: 37 (12/28/2023) LDL: 83 (12/28/2023) TSH: 2.8 (12/28/2023)  DIAGNOSTIC Echocardiogram: Ejection fraction 60-65%, no wall motion abnormalities, Moderate Aortic Stenosis with mean AVG 29 mmHg (03/21/2024)  Previous Studies Myoview  07/25/2017: EF 61%.  Exercise 5: 31 min.  Reached 90% MPHR-7 METS stopped because of dyspnea.  No angina.  Horizontal ST depression of 1.5 mm noted in II, III, aVF, V4, V5 and V6 that returned to baseline after 5 to 9 minutes of recovery.  Normal EF of 65 to 60%. ?  Fixed, medium sized, mild basal to mid inferior perfusion defect suggestive of soft tissue attenuation. =>  Referred for cath because of chest pain, family history and the EKG changes  Cardiac Cath 07/31/2017: Angiographically minimal CAD.  EF 55 to 60%.  Mildly elevated BP.   Transthoracic Echo 04/20/2023: Normal LV size and function with EF 55 to 60%.  No RWMA.  Moderate AS (mean AVG 24, peak 40 mmHg), mild AI  ~12 Day [facility] Ambulatory Monitor : Predominant sinus rhythm with rate range 52-126 bpm, average 72 bpm. Occasional Ectopic Atrial Rhythm -> Reading physician felt this was possibly the cause of patient's symptoms 9 short atrial burst 5-16 beats. => Longest was 16 beats (average 138 bpm, up to 160 bpm; fastest was 5 beats  with an average of 143 bpm up to 151 bpm with normal Rare occasional PACs (1%), rare PVCs   Risk Assessment/Calculations:            Physical Exam:   VS:  BP 120/74 (BP Location: Left Arm, Patient Position: Sitting)   Pulse 66   Ht 5\' 7"  (1.702 m)   Wt 206 lb (93.4 kg)   SpO2 97%   BMI 32.26 kg/m    Wt Readings from Last 3 Encounters:  04/11/24 206 lb (93.4 kg)  06/19/23 210 lb 3.2 oz (95.3 kg)  07/01/18 207 lb (93.9 kg)    GEN: Well nourished, well groomed in no acute distress; healthy-appearing, obese NECK: No JVD; No carotid bruits CARDIAC: RRR,; normal S1, S2;  2/6 SEM at RUSB.  Otherwise no murmurs, rubs, gallops RESPIRATORY:  Clear to auscultation without rales, wheezing or rhonchi ; nonlabored, good air movement. ABDOMEN: Soft, non-tender, non-distended EXTREMITIES:  No edema; No deformity     ASSESSMENT AND PLAN: .    Problem List Items Addressed This Visit  Cardiology Problems   Essential hypertension (Chronic)   Blood pressure well-controlled with metoprolol  75 mg twice daily and amlodipine  5 mg daily. Discussed metoprolol  dosage adjustment if fatigue persists. - Adjust metoprolol  to 1 tablet in the morning if fatigue persists.      Hyperlipidemia associated with type 2 diabetes mellitus (HCC) (Chronic)    Hyperlipidemia Total cholesterol 203 mg/dL, triglycerides 956 mg/dL, HDL 37 mg/dL, LDL 83 mg/dL.  Emphasized LDL target below 70 mg/dL due to aortic stenosis and diabetes. Discussed rosuvastatin if lifestyle changes fail. - Monitor lipid levels. - Consider rosuvastatin 20 mg daily if LDL remains above 70 mg/dL at next PCP follow-up.  Will defer medication to PCP.Jack Oconnor  Type 2 Diabetes Mellitus -> monitored with management per PCP. A1c 7.8, fasting glucose 154 mg/dL.  Discussed dietary management and potential medication need by his PCP follow-up in June. - Monitor blood glucose levels and A1c. - Reassess glucose control in June.      Moderate aortic  stenosis by prior echocardiography - Primary (Chronic)   Moderate aortic stenosis with progression. Mean gradient increased to 29 mmHg. Ejection fraction 60-65%.  Discussed potential progression to severe stenosis as well as red flag symptoms.  Discussed SAVR & TAVR as a future options for therapy worried to develop severe stenosis.Jack Oconnor - Schedule annual echocardiogram for March 2026-will see him back after.. - Educate on symptoms of severe aortic stenosis.      Relevant Orders   ECHOCARDIOGRAM COMPLETE     Other   Palpitations (Chronic)   Palpitations seem to be better controlled.  He remains on the 75 mg twice daily metoprolol  they have a little fatigue.  If this is a problem he can reduce his daytime dose to 50 mg and continue 20 5 at night.  Take additional 25 if he has palpitations.          Follow-Up: Return in about 1 year (around 04/11/2025) for 1 Yr Follow-up, Routine follow up with me.  Recording duration: 30 minutes I spent a total of 53 minutes with the patient: 30 min spent with patient + 23 min spent charting. I spent 53 minutes minutes in the care of Jack Scale. today including reviewing outside labs from PCP check (2 minutes ), reviewing studies (echocardiogram images reviewed personally-6 minutes), face to face time discussing treatment options (30 minutes), 15 minutes dictating, and documenting in the encounter.    Signed, Arleen Lacer, MD, MS Randene Bustard, M.D., M.S. Interventional Cardiologist  [facility]  Pager # [phone] Phone # [phone] [address] [place], [place] [address]

## 2024-04-11 NOTE — Assessment & Plan Note (Signed)
 Blood pressure well-controlled with metoprolol  75 mg twice daily and amlodipine  5 mg daily. Discussed metoprolol  dosage adjustment if fatigue persists. - Adjust metoprolol  to 1 tablet in the morning if fatigue persists.

## 2024-04-11 NOTE — Assessment & Plan Note (Signed)
 Moderate aortic stenosis with progression. Mean gradient increased to 29 mmHg. Ejection fraction 60-65%.  Discussed potential progression to severe stenosis as well as red flag symptoms.  Discussed SAVR & TAVR as a future options for therapy worried to develop severe stenosis.Jack Oconnor - Schedule annual echocardiogram for March 2026-will see him back after.. - Educate on symptoms of severe aortic stenosis.

## 2024-04-11 NOTE — Assessment & Plan Note (Signed)
  Hyperlipidemia Total cholesterol 203 mg/dL, triglycerides 960 mg/dL, HDL 37 mg/dL, LDL 83 mg/dL.  Emphasized LDL target below 70 mg/dL due to aortic stenosis and diabetes. Discussed rosuvastatin if lifestyle changes fail. - Monitor lipid levels. - Consider rosuvastatin 20 mg daily if LDL remains above 70 mg/dL at next PCP follow-up.  Will defer medication to PCP.Jack Oconnor  Type 2 Diabetes Mellitus -> monitored with management per PCP. A1c 7.8, fasting glucose 154 mg/dL.  Discussed dietary management and potential medication need by his PCP follow-up in June. - Monitor blood glucose levels and A1c. - Reassess glucose control in June.

## 2024-05-08 DIAGNOSIS — L989 Disorder of the skin and subcutaneous tissue, unspecified: Secondary | ICD-10-CM | POA: Diagnosis not present

## 2024-05-22 DIAGNOSIS — E782 Mixed hyperlipidemia: Secondary | ICD-10-CM | POA: Diagnosis not present

## 2024-05-22 DIAGNOSIS — E1169 Type 2 diabetes mellitus with other specified complication: Secondary | ICD-10-CM | POA: Diagnosis not present

## 2024-05-22 DIAGNOSIS — M1A09X0 Idiopathic chronic gout, multiple sites, without tophus (tophi): Secondary | ICD-10-CM | POA: Diagnosis not present

## 2024-05-22 DIAGNOSIS — E785 Hyperlipidemia, unspecified: Secondary | ICD-10-CM | POA: Diagnosis not present

## 2024-05-29 DIAGNOSIS — R300 Dysuria: Secondary | ICD-10-CM | POA: Diagnosis not present

## 2024-06-09 DIAGNOSIS — H401113 Primary open-angle glaucoma, right eye, severe stage: Secondary | ICD-10-CM | POA: Diagnosis not present

## 2024-06-11 DIAGNOSIS — I350 Nonrheumatic aortic (valve) stenosis: Secondary | ICD-10-CM | POA: Diagnosis not present

## 2024-06-11 DIAGNOSIS — E785 Hyperlipidemia, unspecified: Secondary | ICD-10-CM | POA: Diagnosis not present

## 2024-06-11 DIAGNOSIS — I351 Nonrheumatic aortic (valve) insufficiency: Secondary | ICD-10-CM | POA: Diagnosis not present

## 2024-06-11 DIAGNOSIS — M1030 Gout due to renal impairment, unspecified site: Secondary | ICD-10-CM | POA: Diagnosis not present

## 2024-06-11 DIAGNOSIS — I70213 Atherosclerosis of native arteries of extremities with intermittent claudication, bilateral legs: Secondary | ICD-10-CM | POA: Diagnosis not present

## 2024-06-11 DIAGNOSIS — I872 Venous insufficiency (chronic) (peripheral): Secondary | ICD-10-CM | POA: Diagnosis not present

## 2024-06-11 DIAGNOSIS — Z0000 Encounter for general adult medical examination without abnormal findings: Secondary | ICD-10-CM | POA: Diagnosis not present

## 2024-06-11 DIAGNOSIS — E1169 Type 2 diabetes mellitus with other specified complication: Secondary | ICD-10-CM | POA: Diagnosis not present

## 2024-06-11 DIAGNOSIS — I10 Essential (primary) hypertension: Secondary | ICD-10-CM | POA: Diagnosis not present

## 2024-06-11 DIAGNOSIS — E782 Mixed hyperlipidemia: Secondary | ICD-10-CM | POA: Diagnosis not present

## 2024-06-11 DIAGNOSIS — M1A09X0 Idiopathic chronic gout, multiple sites, without tophus (tophi): Secondary | ICD-10-CM | POA: Diagnosis not present

## 2024-06-11 DIAGNOSIS — G720 Drug-induced myopathy: Secondary | ICD-10-CM | POA: Diagnosis not present

## 2024-06-25 DIAGNOSIS — I10 Essential (primary) hypertension: Secondary | ICD-10-CM | POA: Diagnosis not present

## 2024-06-25 DIAGNOSIS — I6523 Occlusion and stenosis of bilateral carotid arteries: Secondary | ICD-10-CM | POA: Diagnosis not present

## 2024-06-25 DIAGNOSIS — R000 Tachycardia, unspecified: Secondary | ICD-10-CM | POA: Diagnosis not present

## 2024-06-25 DIAGNOSIS — R29898 Other symptoms and signs involving the musculoskeletal system: Secondary | ICD-10-CM | POA: Diagnosis not present

## 2024-06-25 DIAGNOSIS — E860 Dehydration: Secondary | ICD-10-CM | POA: Diagnosis not present

## 2024-06-25 DIAGNOSIS — A4189 Other specified sepsis: Secondary | ICD-10-CM | POA: Diagnosis not present

## 2024-06-25 DIAGNOSIS — Z7982 Long term (current) use of aspirin: Secondary | ICD-10-CM | POA: Diagnosis not present

## 2024-06-25 DIAGNOSIS — E1165 Type 2 diabetes mellitus with hyperglycemia: Secondary | ICD-10-CM | POA: Diagnosis not present

## 2024-06-25 DIAGNOSIS — R739 Hyperglycemia, unspecified: Secondary | ICD-10-CM | POA: Diagnosis not present

## 2024-06-25 DIAGNOSIS — R29818 Other symptoms and signs involving the nervous system: Secondary | ICD-10-CM | POA: Diagnosis not present

## 2024-06-25 DIAGNOSIS — I4892 Unspecified atrial flutter: Secondary | ICD-10-CM | POA: Diagnosis not present

## 2024-06-25 DIAGNOSIS — Z881 Allergy status to other antibiotic agents status: Secondary | ICD-10-CM | POA: Diagnosis not present

## 2024-06-25 DIAGNOSIS — E876 Hypokalemia: Secondary | ICD-10-CM | POA: Diagnosis not present

## 2024-06-25 DIAGNOSIS — Z880 Allergy status to penicillin: Secondary | ICD-10-CM | POA: Diagnosis not present

## 2024-06-25 DIAGNOSIS — R112 Nausea with vomiting, unspecified: Secondary | ICD-10-CM | POA: Diagnosis not present

## 2024-06-25 DIAGNOSIS — R6520 Severe sepsis without septic shock: Secondary | ICD-10-CM | POA: Diagnosis not present

## 2024-06-25 DIAGNOSIS — R9431 Abnormal electrocardiogram [ECG] [EKG]: Secondary | ICD-10-CM | POA: Diagnosis not present

## 2024-06-25 DIAGNOSIS — E872 Acidosis, unspecified: Secondary | ICD-10-CM | POA: Diagnosis not present

## 2024-06-25 DIAGNOSIS — Z882 Allergy status to sulfonamides status: Secondary | ICD-10-CM | POA: Diagnosis not present

## 2024-06-25 DIAGNOSIS — Z888 Allergy status to other drugs, medicaments and biological substances status: Secondary | ICD-10-CM | POA: Diagnosis not present

## 2024-06-25 DIAGNOSIS — Z79899 Other long term (current) drug therapy: Secondary | ICD-10-CM | POA: Diagnosis not present

## 2024-06-25 DIAGNOSIS — M109 Gout, unspecified: Secondary | ICD-10-CM | POA: Diagnosis not present

## 2024-06-25 DIAGNOSIS — I350 Nonrheumatic aortic (valve) stenosis: Secondary | ICD-10-CM | POA: Diagnosis not present

## 2024-06-25 DIAGNOSIS — K654 Sclerosing mesenteritis: Secondary | ICD-10-CM | POA: Diagnosis not present

## 2024-06-25 DIAGNOSIS — Z1152 Encounter for screening for COVID-19: Secondary | ICD-10-CM | POA: Diagnosis not present

## 2024-06-25 DIAGNOSIS — I361 Nonrheumatic tricuspid (valve) insufficiency: Secondary | ICD-10-CM | POA: Diagnosis not present

## 2024-06-25 DIAGNOSIS — G9341 Metabolic encephalopathy: Secondary | ICD-10-CM | POA: Diagnosis not present

## 2024-06-25 DIAGNOSIS — R509 Fever, unspecified: Secondary | ICD-10-CM | POA: Diagnosis not present

## 2024-06-25 DIAGNOSIS — R410 Disorientation, unspecified: Secondary | ICD-10-CM | POA: Diagnosis not present

## 2024-06-25 DIAGNOSIS — A419 Sepsis, unspecified organism: Secondary | ICD-10-CM | POA: Diagnosis not present

## 2024-06-26 DIAGNOSIS — I350 Nonrheumatic aortic (valve) stenosis: Secondary | ICD-10-CM | POA: Diagnosis not present

## 2024-06-26 DIAGNOSIS — I361 Nonrheumatic tricuspid (valve) insufficiency: Secondary | ICD-10-CM | POA: Diagnosis not present

## 2024-07-07 DIAGNOSIS — I70213 Atherosclerosis of native arteries of extremities with intermittent claudication, bilateral legs: Secondary | ICD-10-CM | POA: Diagnosis not present

## 2024-07-07 DIAGNOSIS — I351 Nonrheumatic aortic (valve) insufficiency: Secondary | ICD-10-CM | POA: Diagnosis not present

## 2024-07-07 DIAGNOSIS — E785 Hyperlipidemia, unspecified: Secondary | ICD-10-CM | POA: Diagnosis not present

## 2024-07-07 DIAGNOSIS — A09 Infectious gastroenteritis and colitis, unspecified: Secondary | ICD-10-CM | POA: Diagnosis not present

## 2024-07-07 DIAGNOSIS — I350 Nonrheumatic aortic (valve) stenosis: Secondary | ICD-10-CM | POA: Diagnosis not present

## 2024-07-07 DIAGNOSIS — E1169 Type 2 diabetes mellitus with other specified complication: Secondary | ICD-10-CM | POA: Diagnosis not present

## 2024-07-07 DIAGNOSIS — I10 Essential (primary) hypertension: Secondary | ICD-10-CM | POA: Diagnosis not present

## 2024-07-07 DIAGNOSIS — I672 Cerebral atherosclerosis: Secondary | ICD-10-CM | POA: Diagnosis not present

## 2024-07-07 DIAGNOSIS — E782 Mixed hyperlipidemia: Secondary | ICD-10-CM | POA: Diagnosis not present

## 2024-07-09 ENCOUNTER — Ambulatory Visit: Payer: Self-pay | Admitting: Cardiology

## 2024-07-09 ENCOUNTER — Encounter: Payer: Self-pay | Admitting: Internal Medicine

## 2024-07-09 NOTE — Progress Notes (Signed)
 Transthoracic echo from [facility] June 26, 2024  Normal LV size and function with EF 60 to 65%.  Mild concentric LVH.  GR 1 DD.  Moderate LA dilation.  Moderate-severe AS (peak AV velocity 4.49M/S, peak-mean gradient 65-41 mmHg.  Further progression of aortic valve mean gradient.  Now in the severe range.  We need to make sure that he is scheduled to be seen by either me or an APP ASAP to discuss results and further plans going forward.  May need to start looking into referral for TAVR.  Alm Clay, MD

## 2024-08-01 ENCOUNTER — Ambulatory Visit: Attending: Cardiology | Admitting: Cardiology

## 2024-08-01 ENCOUNTER — Encounter: Payer: Self-pay | Admitting: Cardiology

## 2024-08-01 VITALS — BP 138/71 | HR 60 | Resp 16 | Ht 67.0 in | Wt 201.8 lb

## 2024-08-01 DIAGNOSIS — R002 Palpitations: Secondary | ICD-10-CM

## 2024-08-01 DIAGNOSIS — E1169 Type 2 diabetes mellitus with other specified complication: Secondary | ICD-10-CM

## 2024-08-01 DIAGNOSIS — E785 Hyperlipidemia, unspecified: Secondary | ICD-10-CM | POA: Diagnosis not present

## 2024-08-01 DIAGNOSIS — I10 Essential (primary) hypertension: Secondary | ICD-10-CM

## 2024-08-01 DIAGNOSIS — I350 Nonrheumatic aortic (valve) stenosis: Secondary | ICD-10-CM

## 2024-08-01 NOTE — Assessment & Plan Note (Signed)
 Moderate to severe stenosis with mean gradient 40-41 mmHg, valve area <1 cm, ejection fraction 60-65%, moderate left atrial dilation. Asymptomatic. - Order echocardiogram in late December 2025 - Monitor for symptoms: chest pain, syncope, shortness of breath. - Discuss potential TAVR with valve clinic if echocardiogram indicates progression.

## 2024-08-01 NOTE — Patient Instructions (Addendum)
 Look out for Aortic stenosis symptoms  chest  pain , shortness of breath, and syncope ( means passing out)   Medication Instructions:  No changes   *If you need a refill on your cardiac medications before your next appointment, please call your pharmacy*   Lab Work: Not needed If you have labs (blood work) drawn today and your tests are completely normal, you will receive your results only by: MyChart Message (if you have MyChart) OR A paper copy in the mail If you have any lab test that is abnormal or we need to change your treatment, we will call you to review the results.   Testing/Procedures:  Your physician has requested that you have an echocardiogram. Echocardiography is a painless test that uses sound waves to create images of your heart. It provides your doctor with information about the size and shape of your heart and how well your heart's chambers and valves are working. This procedure takes approximately one hour. There are no restrictions for this procedure. Please do NOT wear cologne, perfume, aftershave, or lotions (deodorant is allowed). Please arrive 15 minutes prior to your appointment time.  Please note: We ask at that you not bring children with you during ultrasound (echo/ vascular) testing. Due to room size and safety concerns, children are not allowed in the ultrasound rooms during exams. Our front office staff cannot provide observation of children in our lobby area while testing is being conducted. An adult accompanying a patient to their appointment will only be allowed in the ultrasound room at the discretion of the ultrasound technician under special circumstances. We apologize for any inconvenience.   Follow-Up: At [facility], you and your health needs are our priority.  As part of our continuing mission to provide you with exceptional heart care, we have created designated Provider Care Teams.  These Care Teams include your primary Cardiologist (physician)  and Advanced Practice Providers (APPs -  Physician Assistants and Nurse Practitioners) who all work together to provide you with the care you need, when you need it.     Your next appointment:   5 month(s)  The format for your next appointment:   In Person  Provider:   Alm Clay, MD   Other Instructions

## 2024-08-01 NOTE — Progress Notes (Signed)
 Cardiology Office Note:  .   Date:  08/01/2024  ID:  Jack Oconnor., DOB 16-Jun-1946, MRN [id number] PCP: Street, Lonni HERO, MD  [facility] Providers Cardiologist:  Alm Clay, MD     Chief Complaint  Patient presents with   Moderate aortic stenosis by prior echocardiography   Hospitalization Follow-up    Echocardiogram done while hospitalized for gastroenteritis versus food poisoning.    Patient Profile: .     Jack Oconnor. is a  78 y.o. male  with a PMH notable for Mod AS, HTN, PACs (atrial runs) & PVCs who presents here for close f/u to discuss f/u Echo results showing progression of AS.  Referring Provider: Street, Lonni HERO, *.  Jack Oconnor. was seen on June 19, 2023 to review his monitor results as well as his echocardiogram showing persistent moderate AS with mean gradient of 24 mmHg.  Monitor showed symptomatic PACs and PVCs with short burst of atrial runs.  We increased his metoprolol  to 75 mg twice daily.  Plan was for close follow-up to discuss symptoms with addition of beta-blocker, and to plan for follow-up echocardiogram in April 2025.     Jack Oconnor. was last seen on April 11, 2024 to follow-up his echocardiogram showing progression of aortic stenosis.  Mean AVG increased up to 29 mmHg.  Plan was annual follow-up echocardiogram.  Was otherwise doing well.  We discussed having PCP monitor lipids closely and if LDL remaining above 70 that that would recommend rosuvastatin.  Has not had labs checked since.  Palpitations were stable on 75 mg Lopressor  twice daily, but had some fatigue.  We discussed potentially taking 50 mg in the morning if fatigue gets worse, but also potentially taking extra 25 if he has additional palpitations.  He was recently hospitalized at [facility] the week of July 4 for what amounted to probably be through poisoning, but there was concern for possible sepsis.  He was almost unresponsive for 2 days, but came around  after IV antibiotics and IV fluids.  After the first day, he began having diarrhea and that spread account with the idea of potential food poisoning.  He has gradually gotten stronger since then.  As part of his evaluation they are with the murmur they checked a 2D echo looking for source of fever.  This showed worsening gradient up to now 40 to 41 mmHg.  He has not had any change in symptoms but based on seeing the results I had him come into the discuss follow-up plans.  Subjective  Discussed the use of AI scribe software for clinical note transcription with the patient, who gave verbal consent to proceed.  History of Present Illness History of Present Illness Jack Oconnor. is a 78 year old male with aortic stenosis who presents for follow-up regarding his recent echocardiogram.  He was recently hospitalized at [facility] after being found unresponsive, initially suspected to have had a stroke. During this hospitalization, he had a high fever of 103F and was treated with antibiotics. Subsequent tests revealed food poisoning, which led to severe diarrhea. During this hospital stay, an echocardiogram was performed.  The echocardiogram performed at [facility] showed mild thickening with normal function and an ejection fraction of 60-65%. The left atrium was moderately dilated, and the mean gradient across the aortic valve was reported as 40-41 mmHg, with a calculated valve area just under 1 cm. A previous echocardiogram in March showed a  mean gradient of about 29 mmHg and a velocity of 3.5 m/s.  He experiences no symptoms of chest pain, shortness of breath, or angina during routine activities or when climbing stairs. However, he hears his heartbeat in his left ear when lying on his right side, but not when lying on his left side. No heart racing, skipping, or shortness of breath at night. He reports some swelling in his legs, which he attributes to his diabetes, but denies significant  puffy swelling.  His past medical history includes diabetes, and his cholesterol levels were last checked in January with an LDL of 83 mg/dL.     Objective   Current cardiac meds: Aspirin  81 mg daily, amlodipine  5 mg daily, Lopressor  50 mg tablet-1.5 tabs (75 mg) twice daily; Benefiber powder 15 mL daily.  Studies Reviewed: SABRA   EKG Interpretation Date/Time:  Friday August 01 2024 09:57:23 EDT Ventricular Rate:  60 PR Interval:  144 QRS Duration:  88 QT Interval:  414 QTC Calculation: 414 R Axis:   10  Text Interpretation: Normal sinus rhythm T wave abnormality, consider inferior ischemia When compared with ECG of 19-Jun-2023 14:49, Nonspecific T wave abnormality, improved in Lateral leads Confirmed by Anner Lenis ([id number]) on 08/01/2024 10:23:15 AM    Lab Results  Component Value Date   CHOL 157 07/19/2017   HDL 42 07/19/2017   LDLCALC 84 07/19/2017   TRIG 154 (H) 07/19/2017   CHOLHDL 3.7 07/19/2017   Lab Results  Component Value Date   NA 142 07/30/2017   K 4.7 07/30/2017   CREATININE 1.05 07/30/2017   GFRNONAA 72 07/30/2017   GLUCOSE 89 07/30/2017     RADIOLOGY Head MRI: Performed to rule out stroke, no specific findings discussed (06/2024)  DIAGNOSTIC Echocardiogram: Mild thickening, normal function, ejection fraction 60-65%, moderately dilated left atrium, mean gradient 40-41 mmHg, valve area just under 1 cm, moderate to severe aortic stenosis (06/2024)  Risk Assessment/Calculations:               Physical Exam:   VS:  BP 138/71 (BP Location: Left Arm, Patient Position: Sitting, Cuff Size: Normal)   Pulse 60   Resp 16   Ht 5' 7 (1.702 m)   Wt 201 lb 12.8 oz (91.5 kg)   SpO2 96%   BMI 31.61 kg/m    Wt Readings from Last 3 Encounters:  08/01/24 201 lb 12.8 oz (91.5 kg)  04/11/24 206 lb (93.4 kg)  06/19/23 210 lb 3.2 oz (95.3 kg)    GEN: Well nourished, well groomed in no acute distress; mildly obese but otherwise healthy-appearing NECK: No JVD; No  carotid bruits CARDIAC: Normal S1, S2; RRR, harsh 2-3/6 SEM at RUSB but otherwise no, rubs or gallops RESPIRATORY:  Clear to auscultation without rales, wheezing or rhonchi ; nonlabored, good air movement. ABDOMEN: Soft, non-tender, non-distended EXTREMITIES:  No edema; No deformity      ASSESSMENT AND PLAN: .    Problem List Items Addressed This Visit       Cardiology Problems   Essential hypertension (Chronic)   Blood pressure well-controlled with metoprolol  75 mg twice daily and amlodipine  5 mg daily. Discussed metoprolol  dosage adjustment if fatigue persists.  - Continue current dose of amlodipine  5 mg daily and Lopressor  75 mg twice daily Given the option to reduce a.m. dose of blood pressure to 50 mg if symptoms of fatigue were to persist.      Hyperlipidemia associated with type 2 diabetes mellitus (HCC) (Chronic)   LDL  at 83 mg/dL, target <29 mg/dL to reduce coronary artery disease risk. - Check cholesterol levels at next primary care visit. - Consider treatment if LDL remains above target.      Moderate aortic stenosis by prior echocardiography - Primary (Chronic)   Moderate to severe stenosis with mean gradient 40-41 mmHg, valve area <1 cm, ejection fraction 60-65%, moderate left atrial dilation. Asymptomatic. - Order echocardiogram in late December 2025 - Monitor for symptoms: chest pain, syncope, shortness of breath. - Discuss potential TAVR with valve clinic if echocardiogram indicates progression.      Relevant Orders   EKG 12-Lead (Completed)   ECHOCARDIOGRAM COMPLETE     Other   Palpitations (Chronic)   Certainly improved since increasing to 75 mg twice daily.  Less prominent fatigue.  I think right now he is just recovering from hospitalization illness and is having a hard time knowing fatigue from medicines versus fatigue from illness .  In the absence of progressive palpitations, would simply continue at low 75 mg twice daily.      Relevant Orders    ECHOCARDIOGRAM COMPLETE          Follow-Up: Return in about 5 months (around 01/01/2025) for Routine follow up with me.  Total time spent: 33 min spent with patient + 23 min spent charting = 56 min I spent 56 minutes in the care of Jack Oconnor. today including reviewing labs (1 minute), reviewing studies (prior echo reviewed and compared to report from outside echo), reviewing outside studies (5 minutes reviewing outside echo result and comparing to prior echo), face to face time discussing treatment options (33 minutes), reviewing records from [facility] clinic note (4 minutes), 13 minutes dictating, and documenting in the encounter.      Signed, Alm MICAEL Clay, MD, MS Alm Clay, M.D., M.S. Interventional Chartered certified accountant  Pager # [phone]

## 2024-08-01 NOTE — Assessment & Plan Note (Signed)
 Certainly improved since increasing to 75 mg twice daily.  Less prominent fatigue.  I think right now he is just recovering from hospitalization illness and is having a hard time knowing fatigue from medicines versus fatigue from illness .  In the absence of progressive palpitations, would simply continue at low 75 mg twice daily.

## 2024-08-01 NOTE — Assessment & Plan Note (Signed)
 Blood pressure well-controlled with metoprolol  75 mg twice daily and amlodipine  5 mg daily. Discussed metoprolol  dosage adjustment if fatigue persists.  - Continue current dose of amlodipine  5 mg daily and Lopressor  75 mg twice daily Given the option to reduce a.m. dose of blood pressure to 50 mg if symptoms of fatigue were to persist.

## 2024-08-01 NOTE — Assessment & Plan Note (Signed)
 LDL at 83 mg/dL, target <29 mg/dL to reduce coronary artery disease risk. - Check cholesterol levels at next primary care visit. - Consider treatment if LDL remains above target.

## 2024-08-12 DIAGNOSIS — B369 Superficial mycosis, unspecified: Secondary | ICD-10-CM | POA: Diagnosis not present

## 2024-08-12 DIAGNOSIS — L989 Disorder of the skin and subcutaneous tissue, unspecified: Secondary | ICD-10-CM | POA: Diagnosis not present

## 2024-09-03 DIAGNOSIS — E782 Mixed hyperlipidemia: Secondary | ICD-10-CM | POA: Diagnosis not present

## 2024-09-03 DIAGNOSIS — E1169 Type 2 diabetes mellitus with other specified complication: Secondary | ICD-10-CM | POA: Diagnosis not present

## 2024-09-03 DIAGNOSIS — M1A09X0 Idiopathic chronic gout, multiple sites, without tophus (tophi): Secondary | ICD-10-CM | POA: Diagnosis not present

## 2024-09-03 DIAGNOSIS — Z79899 Other long term (current) drug therapy: Secondary | ICD-10-CM | POA: Diagnosis not present

## 2024-09-03 LAB — LAB REPORT - SCANNED: A1c: 7.3

## 2024-09-11 DIAGNOSIS — I672 Cerebral atherosclerosis: Secondary | ICD-10-CM | POA: Diagnosis not present

## 2024-09-11 DIAGNOSIS — E1169 Type 2 diabetes mellitus with other specified complication: Secondary | ICD-10-CM | POA: Diagnosis not present

## 2024-09-11 DIAGNOSIS — H34831 Tributary (branch) retinal vein occlusion, right eye, with macular edema: Secondary | ICD-10-CM | POA: Diagnosis not present

## 2024-09-11 DIAGNOSIS — M1A09X0 Idiopathic chronic gout, multiple sites, without tophus (tophi): Secondary | ICD-10-CM | POA: Diagnosis not present

## 2024-09-11 DIAGNOSIS — H348111 Central retinal vein occlusion, right eye, with retinal neovascularization: Secondary | ICD-10-CM | POA: Diagnosis not present

## 2024-09-11 DIAGNOSIS — K2960 Other gastritis without bleeding: Secondary | ICD-10-CM | POA: Diagnosis not present

## 2024-09-11 DIAGNOSIS — E785 Hyperlipidemia, unspecified: Secondary | ICD-10-CM | POA: Diagnosis not present

## 2024-09-11 DIAGNOSIS — I70213 Atherosclerosis of native arteries of extremities with intermittent claudication, bilateral legs: Secondary | ICD-10-CM | POA: Diagnosis not present

## 2024-09-11 DIAGNOSIS — I10 Essential (primary) hypertension: Secondary | ICD-10-CM | POA: Diagnosis not present

## 2024-09-11 DIAGNOSIS — Z23 Encounter for immunization: Secondary | ICD-10-CM | POA: Diagnosis not present

## 2024-09-11 DIAGNOSIS — E782 Mixed hyperlipidemia: Secondary | ICD-10-CM | POA: Diagnosis not present

## 2024-10-13 DIAGNOSIS — H401113 Primary open-angle glaucoma, right eye, severe stage: Secondary | ICD-10-CM | POA: Diagnosis not present

## 2024-12-01 ENCOUNTER — Ambulatory Visit (HOSPITAL_COMMUNITY)

## 2024-12-12 ENCOUNTER — Encounter: Payer: Self-pay | Admitting: Cardiology

## 2025-02-23 ENCOUNTER — Other Ambulatory Visit (HOSPITAL_COMMUNITY)
